# Patient Record
Sex: Female | Born: 1940 | Race: White | Hispanic: No | Smoking: Former smoker
Health system: Southern US, Community
[De-identification: ages and names within clinical notes are randomized; demographics above are authoritative.]

## PROBLEM LIST (undated history)

## (undated) DIAGNOSIS — E785 Hyperlipidemia, unspecified: Secondary | ICD-10-CM

## (undated) DIAGNOSIS — F419 Anxiety disorder, unspecified: Secondary | ICD-10-CM

## (undated) DIAGNOSIS — F329 Major depressive disorder, single episode, unspecified: Secondary | ICD-10-CM

## (undated) DIAGNOSIS — F32A Depression, unspecified: Secondary | ICD-10-CM

## (undated) DIAGNOSIS — I7300 Raynaud's syndrome without gangrene: Secondary | ICD-10-CM

## (undated) DIAGNOSIS — I498 Other specified cardiac arrhythmias: Secondary | ICD-10-CM

## (undated) DIAGNOSIS — D649 Anemia, unspecified: Secondary | ICD-10-CM

## (undated) DIAGNOSIS — M349 Systemic sclerosis, unspecified: Secondary | ICD-10-CM

## (undated) DIAGNOSIS — I10 Essential (primary) hypertension: Secondary | ICD-10-CM

## (undated) DIAGNOSIS — Z90710 Acquired absence of both cervix and uterus: Secondary | ICD-10-CM

## (undated) DIAGNOSIS — I2510 Atherosclerotic heart disease of native coronary artery without angina pectoris: Secondary | ICD-10-CM

## (undated) DIAGNOSIS — K449 Diaphragmatic hernia without obstruction or gangrene: Secondary | ICD-10-CM

## (undated) DIAGNOSIS — I480 Paroxysmal atrial fibrillation: Secondary | ICD-10-CM

## (undated) DIAGNOSIS — I471 Supraventricular tachycardia, unspecified: Secondary | ICD-10-CM

## (undated) DIAGNOSIS — K219 Gastro-esophageal reflux disease without esophagitis: Secondary | ICD-10-CM

## (undated) HISTORY — PX: TOTAL ABDOMINAL HYSTERECTOMY: SHX209

## (undated) HISTORY — DX: Hyperlipidemia, unspecified: E78.5

## (undated) HISTORY — DX: Supraventricular tachycardia, unspecified: I47.10

## (undated) HISTORY — DX: Other specified cardiac arrhythmias: I49.8

## (undated) HISTORY — DX: Diaphragmatic hernia without obstruction or gangrene: K44.9

## (undated) HISTORY — DX: Supraventricular tachycardia: I47.1

## (undated) HISTORY — DX: Atherosclerotic heart disease of native coronary artery without angina pectoris: I25.10

## (undated) HISTORY — DX: Acquired absence of both cervix and uterus: Z90.710

## (undated) HISTORY — DX: Raynaud's syndrome without gangrene: I73.00

## (undated) HISTORY — DX: Paroxysmal atrial fibrillation: I48.0

## (undated) HISTORY — DX: Essential (primary) hypertension: I10

## (undated) HISTORY — DX: Depression, unspecified: F32.A

## (undated) HISTORY — DX: Anemia, unspecified: D64.9

## (undated) HISTORY — PX: OTHER SURGICAL HISTORY: SHX169

## (undated) HISTORY — PX: BREAST ENHANCEMENT SURGERY: SHX7

## (undated) HISTORY — DX: Anxiety disorder, unspecified: F41.9

## (undated) HISTORY — DX: Systemic sclerosis, unspecified: M34.9

## (undated) HISTORY — DX: Major depressive disorder, single episode, unspecified: F32.9

## (undated) HISTORY — DX: Gastro-esophageal reflux disease without esophagitis: K21.9

---

## 1998-06-19 ENCOUNTER — Emergency Department (HOSPITAL_COMMUNITY): Admission: EM | Admit: 1998-06-19 | Discharge: 1998-06-19 | Payer: Self-pay | Admitting: *Deleted

## 1998-11-17 ENCOUNTER — Other Ambulatory Visit: Admission: RE | Admit: 1998-11-17 | Discharge: 1998-11-17 | Payer: Self-pay | Admitting: Family Medicine

## 2000-09-18 ENCOUNTER — Encounter: Admission: RE | Admit: 2000-09-18 | Discharge: 2000-09-18 | Payer: Self-pay | Admitting: Family Medicine

## 2000-09-18 ENCOUNTER — Encounter: Payer: Self-pay | Admitting: Family Medicine

## 2000-11-28 ENCOUNTER — Encounter: Payer: Self-pay | Admitting: Family Medicine

## 2000-11-28 ENCOUNTER — Encounter: Admission: RE | Admit: 2000-11-28 | Discharge: 2000-11-28 | Payer: Self-pay | Admitting: Family Medicine

## 2000-12-03 ENCOUNTER — Encounter: Admission: RE | Admit: 2000-12-03 | Discharge: 2000-12-03 | Payer: Self-pay | Admitting: Family Medicine

## 2000-12-03 ENCOUNTER — Encounter: Payer: Self-pay | Admitting: Family Medicine

## 2002-02-25 ENCOUNTER — Other Ambulatory Visit: Admission: RE | Admit: 2002-02-25 | Discharge: 2002-02-25 | Payer: Self-pay | Admitting: Family Medicine

## 2002-09-08 ENCOUNTER — Encounter: Admission: RE | Admit: 2002-09-08 | Discharge: 2002-09-08 | Payer: Self-pay | Admitting: Family Medicine

## 2002-09-08 ENCOUNTER — Encounter: Payer: Self-pay | Admitting: Family Medicine

## 2006-03-24 ENCOUNTER — Encounter: Admission: RE | Admit: 2006-03-24 | Discharge: 2006-03-24 | Payer: Self-pay | Admitting: Family Medicine

## 2007-07-29 ENCOUNTER — Ambulatory Visit: Payer: Self-pay | Admitting: Cardiovascular Disease

## 2007-07-29 ENCOUNTER — Encounter (INDEPENDENT_AMBULATORY_CARE_PROVIDER_SITE_OTHER): Payer: Self-pay | Admitting: Internal Medicine

## 2007-07-29 ENCOUNTER — Observation Stay (HOSPITAL_COMMUNITY): Admission: EM | Admit: 2007-07-29 | Discharge: 2007-08-01 | Payer: Self-pay | Admitting: Internal Medicine

## 2007-08-17 ENCOUNTER — Encounter: Admission: RE | Admit: 2007-08-17 | Discharge: 2007-08-17 | Payer: Self-pay | Admitting: Family Medicine

## 2007-08-28 ENCOUNTER — Ambulatory Visit: Payer: Self-pay | Admitting: Gastroenterology

## 2007-09-25 ENCOUNTER — Encounter: Payer: Self-pay | Admitting: Gastroenterology

## 2007-09-25 ENCOUNTER — Ambulatory Visit: Payer: Self-pay | Admitting: Gastroenterology

## 2007-10-23 ENCOUNTER — Ambulatory Visit: Payer: Self-pay | Admitting: Gastroenterology

## 2007-10-23 LAB — CONVERTED CEMR LAB
ALT: 18 units/L (ref 0–35)
Albumin: 3.5 g/dL (ref 3.5–5.2)
Alkaline Phosphatase: 48 units/L (ref 39–117)
BUN: 16 mg/dL (ref 6–23)
Calcium: 9.4 mg/dL (ref 8.4–10.5)
Creatinine, Ser: 0.8 mg/dL (ref 0.4–1.2)
Eosinophils Absolute: 0.1 10*3/uL (ref 0.0–0.6)
Eosinophils Relative: 2 % (ref 0.0–5.0)
Folate: 19.7 ng/mL
GFR calc Af Amer: 92 mL/min
HCT: 37.1 % (ref 36.0–46.0)
MCV: 85.5 fL (ref 78.0–100.0)
Platelets: 264 10*3/uL (ref 150–400)
Potassium: 4.2 meq/L (ref 3.5–5.1)
RBC: 4.34 M/uL (ref 3.87–5.11)
RDW: 13.5 % (ref 11.5–14.6)
Rhuematoid fact SerPl-aCnc: <20 intl units/mL — ABNORMAL LOW (ref 0.0–20.0)
Saturation Ratios: 15.5 % — ABNORMAL LOW (ref 20.0–50.0)
TSH: 2.8 microintl units/mL (ref 0.35–5.50)
Total Bilirubin: 0.6 mg/dL (ref 0.3–1.2)
Transferrin: 226.5 mg/dL (ref >=212.0)
WBC: 5.6 10*3/uL (ref 4.5–10.5)

## 2007-11-25 ENCOUNTER — Ambulatory Visit: Payer: Self-pay | Admitting: Cardiovascular Disease

## 2007-11-25 ENCOUNTER — Inpatient Hospital Stay (HOSPITAL_BASED_OUTPATIENT_CLINIC_OR_DEPARTMENT_OTHER): Admission: RE | Admit: 2007-11-25 | Discharge: 2007-11-26 | Payer: Self-pay | Admitting: Cardiovascular Disease

## 2007-11-25 LAB — CONVERTED CEMR LAB
Basophils Absolute: 0 10*3/uL (ref 0.0–0.1)
Eosinophils Absolute: 0.2 10*3/uL (ref 0.0–0.6)
GFR calc non Af Amer: 52 mL/min
HCT: 34.2 % — ABNORMAL LOW (ref 36.0–46.0)
Hemoglobin: 12 g/dL (ref 12.0–15.0)
MCHC: 35.2 g/dL (ref 30.0–36.0)
MCV: 84 fL (ref 78.0–100.0)
Monocytes Absolute: 0.8 10*3/uL — ABNORMAL HIGH (ref 0.2–0.7)
Neutro Abs: 7 10*3/uL (ref 1.4–7.7)
Neutrophils Relative %: 68.3 % (ref 43.0–77.0)
Potassium: 3.2 meq/L — ABNORMAL LOW (ref 3.5–5.1)
Sodium: 133 meq/L — ABNORMAL LOW (ref 135–145)
aPTT: 36.8 s — ABNORMAL HIGH (ref 21.7–29.8)

## 2007-11-26 ENCOUNTER — Ambulatory Visit: Payer: Self-pay | Admitting: Cardiovascular Disease

## 2007-11-26 ENCOUNTER — Inpatient Hospital Stay (HOSPITAL_COMMUNITY): Admission: AD | Admit: 2007-11-26 | Discharge: 2007-11-30 | Payer: Self-pay | Admitting: Cardiovascular Disease

## 2007-12-15 ENCOUNTER — Ambulatory Visit: Payer: Self-pay

## 2007-12-15 ENCOUNTER — Encounter: Payer: Self-pay | Admitting: Cardiovascular Disease

## 2007-12-15 ENCOUNTER — Ambulatory Visit: Payer: Self-pay | Admitting: Cardiovascular Disease

## 2007-12-18 ENCOUNTER — Inpatient Hospital Stay (HOSPITAL_COMMUNITY): Admission: EM | Admit: 2007-12-18 | Discharge: 2007-12-23 | Payer: Self-pay | Admitting: Emergency Medicine

## 2007-12-18 ENCOUNTER — Ambulatory Visit: Payer: Self-pay | Admitting: Internal Medicine

## 2007-12-31 DIAGNOSIS — F329 Major depressive disorder, single episode, unspecified: Secondary | ICD-10-CM

## 2007-12-31 DIAGNOSIS — I2510 Atherosclerotic heart disease of native coronary artery without angina pectoris: Secondary | ICD-10-CM

## 2007-12-31 DIAGNOSIS — I7300 Raynaud's syndrome without gangrene: Secondary | ICD-10-CM | POA: Insufficient documentation

## 2007-12-31 DIAGNOSIS — D649 Anemia, unspecified: Secondary | ICD-10-CM | POA: Insufficient documentation

## 2007-12-31 DIAGNOSIS — F411 Generalized anxiety disorder: Secondary | ICD-10-CM | POA: Insufficient documentation

## 2007-12-31 DIAGNOSIS — I498 Other specified cardiac arrhythmias: Secondary | ICD-10-CM | POA: Insufficient documentation

## 2007-12-31 DIAGNOSIS — K449 Diaphragmatic hernia without obstruction or gangrene: Secondary | ICD-10-CM | POA: Insufficient documentation

## 2007-12-31 DIAGNOSIS — E785 Hyperlipidemia, unspecified: Secondary | ICD-10-CM | POA: Insufficient documentation

## 2007-12-31 DIAGNOSIS — I4891 Unspecified atrial fibrillation: Secondary | ICD-10-CM | POA: Insufficient documentation

## 2007-12-31 DIAGNOSIS — K219 Gastro-esophageal reflux disease without esophagitis: Secondary | ICD-10-CM

## 2007-12-31 DIAGNOSIS — I10 Essential (primary) hypertension: Secondary | ICD-10-CM

## 2008-01-08 ENCOUNTER — Ambulatory Visit: Payer: Self-pay | Admitting: Cardiovascular Disease

## 2008-07-12 ENCOUNTER — Ambulatory Visit: Payer: Self-pay | Admitting: Cardiovascular Disease

## 2008-12-16 ENCOUNTER — Encounter: Payer: Self-pay | Admitting: Cardiovascular Disease

## 2008-12-21 ENCOUNTER — Ambulatory Visit: Payer: Self-pay | Admitting: Cardiovascular Disease

## 2008-12-21 ENCOUNTER — Encounter: Payer: Self-pay | Admitting: Cardiovascular Disease

## 2009-05-26 ENCOUNTER — Ambulatory Visit: Payer: Self-pay | Admitting: Cardiology

## 2009-05-26 ENCOUNTER — Telehealth: Payer: Self-pay | Admitting: Cardiovascular Disease

## 2009-05-27 ENCOUNTER — Inpatient Hospital Stay (HOSPITAL_COMMUNITY): Admission: EM | Admit: 2009-05-27 | Discharge: 2009-05-29 | Payer: Self-pay | Admitting: Emergency Medicine

## 2009-05-28 ENCOUNTER — Encounter: Payer: Self-pay | Admitting: Cardiology

## 2009-06-21 ENCOUNTER — Encounter: Admission: RE | Admit: 2009-06-21 | Discharge: 2009-06-21 | Payer: Self-pay | Admitting: Family Medicine

## 2009-06-21 ENCOUNTER — Encounter (INDEPENDENT_AMBULATORY_CARE_PROVIDER_SITE_OTHER): Payer: Self-pay | Admitting: Interventional Radiology

## 2009-06-21 ENCOUNTER — Other Ambulatory Visit: Admission: RE | Admit: 2009-06-21 | Discharge: 2009-06-21 | Payer: Self-pay | Admitting: Interventional Radiology

## 2009-07-03 ENCOUNTER — Ambulatory Visit: Payer: Self-pay | Admitting: Cardiovascular Disease

## 2009-07-03 DIAGNOSIS — E041 Nontoxic single thyroid nodule: Secondary | ICD-10-CM | POA: Insufficient documentation

## 2009-11-09 ENCOUNTER — Encounter (INDEPENDENT_AMBULATORY_CARE_PROVIDER_SITE_OTHER): Payer: Self-pay | Admitting: *Deleted

## 2010-01-12 ENCOUNTER — Encounter: Payer: Self-pay | Admitting: Cardiovascular Disease

## 2010-01-25 ENCOUNTER — Ambulatory Visit: Payer: Self-pay | Admitting: Cardiovascular Disease

## 2010-08-16 ENCOUNTER — Ambulatory Visit: Payer: Self-pay | Admitting: Cardiovascular Disease

## 2010-08-16 ENCOUNTER — Encounter: Payer: Self-pay | Admitting: Cardiovascular Disease

## 2010-11-24 ENCOUNTER — Encounter: Payer: Self-pay | Admitting: Family Medicine

## 2010-11-26 ENCOUNTER — Encounter: Payer: Self-pay | Admitting: Family Medicine

## 2010-12-04 NOTE — Assessment & Plan Note (Signed)
Summary: 6 month return.amber   History of Present Illness: Kathy George returns in followup.  She had previous stent to the LAD.  She hada re-look and the stent was widely patent despite atypical chest pain.The patient continues to feel fatigued.  I explained to her that herheart function was normal at 55%.  She should not be having any fatigueor shortness of breath from her heart.  She is not having any more chestpain. She needs to start exercising again.  She is on a lower dose of HCTZ and crestor now.  She has some plantar fascitis that is bothering her but otherwise nothing new  Current Problems (verified): 1)  Thyroid Nodule, Right  (ICD-241.0) 2)  Atrial Fibrillation, Paroxysmal  (ICD-427.31) 3)  Anemia  (ICD-285.9) 4)  Raynaud's Disease  (ICD-443.0) 5)  Depression  (ICD-311) 6)  Anxiety  (ICD-300.00) 7)  Dyslipidemia  (ICD-272.4) 8)  Supraventricular Tachycardia  (ICD-427.89) 9)  Cad  (ICD-414.00) 10)  Hypertension  (ICD-401.9) 11)  Hiatal Hernia  (ICD-553.3) 12)  Gerd  (ICD-530.81)  Current Medications (verified): 1)  Hydrochlorothiazide 25 Mg Tabs (Hydrochlorothiazide) .... 1/2 Tab Every Other Day 2)  Vivelle-Dot 0.0375 Mg/24hr Pttw (Estradiol) .... Twice Weekly 3)  Carvedilol 6.25 Mg Tabs (Carvedilol) .Marland Kitchen.. 1 Tab Two Times A Day 4)  Systane 0.4-0.3 % Soln (Polyethyl Glycol-Propyl Glycol) .... 2 Drops Two Times A Day As Needed 5)  Omeprazole 20 Mg Cpdr (Omeprazole) .Marland Kitchen.. 1 Tab By Mouth Once Daily 6)  Aspirin Ec 325 Mg Tbec (Aspirin) .... Take One Tablet By Mouth Daily 7)  Crestor 5 Mg Tabs (Rosuvastatin Calcium) .Marland Kitchen.. 1 Tab By Mouth Every Other Day 8)  One-A-Day Extras Antioxidant  Caps (Multiple Vitamins-Minerals) .Marland Kitchen.. 1 Tab By Mouth Once Daily 9)  Coq10 200 Mg Caps (Coenzyme Q10) .Marland Kitchen.. 1 Tab By Mouth Once Daily 10)  Vitamin B-12 .Marland Kitchen.. 1 Tab By Mouth Once Daily 11)  Vitamin D3 1000 Unit Caps (Cholecalciferol) .Marland Kitchen.. 1  Tab By Mouth Once Daily 12)  Nitrostat 0.4 Mg Subl (Nitroglycerin)  .Marland Kitchen.. 1 Tablet Under Tongue At Onset of Chest Pain; You May Repeat Every 5 Minutes For Up To 3 Doses. 13)  Allergy Medication .Marland Kitchen.. 1 Tab By Mouth Once Daily 14)  Ibuprofen 200 Mg Tabs (Ibuprofen) .... Tab By Mouth At Bedtime  Allergies (verified): No Known Drug Allergies  Past History:  Past Medical History: Last updated: 12/20/2008 Current Problems:  ATRIAL FIBRILLATION, PAROXYSMAL (ICD-427.31) ANEMIA (ICD-285.9) RAYNAUD'S DISEASE (ICD-443.0) DEPRESSION (ICD-311) ANXIETY (ICD-300.00) DYSLIPIDEMIA (ICD-272.4) SUPRAVENTRICULAR TACHYCARDIA (ICD-427.89) CAD (ICD-414.00): DES LAD 11/2006 patent on cath 2/09 HYPERTENSION (ICD-401.9) HIATAL HERNIA (ICD-553.3) GERD (ICD-530.81)  Past Surgical History: Last updated: 12/20/2008 hysterectomy age 66 breast implants CAD: Successful IVUS guided PCI of the lesion in the proximal   LAD using a Promus drug-eluting stent with improvement in Fentanyl   narrowing from 80% to 0% right rotator cuff repair  Family History: Last updated: 12/20/2008  Mother intracranial hemorrhage, chronic kidney disease   . Grandfather coronary artery disease, intracranial hemorrhage and   hypercholesterolemia.  Daughter also had hypercholesterolemia.   Social History: Last updated: 12/20/2008 She is single and lives with a boyfriend.  She has some college education. 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  Uses ethanol socially.Alcohol Use - yes  Review of Systems       Denies fever, malais, weight loss, blurry vision, decreased visual acuity, cough, sputum, SOB, hemoptysis, pleuritic pain, palpitaitons, heartburn, abdominal pain, melena, lower extremity edema, claudication, or  rash.   Vital Signs:  Patient profile:   70 year old female Height:      64 inches Weight:      166 pounds BMI:     28.60 Pulse rate:   57 / minute Resp:     12 per minute BP sitting:   130 / 80  (left arm)  Vitals Entered By: Kem Parkinson (August 16, 2010 11:15 AM)   Impression & Recommendations:  Problem # 1:  DYSLIPIDEMIA (ICD-272.4) Labs per [employer].  Cotnineu statin Her updated medication list for this problem includes:    Crestor 5 Mg Tabs (Rosuvastatin calcium) .Marland Kitchen... 1 tab by mouth every other day  Problem # 2:  CAD (ICD-414.00) Stable no angina and normla ECG Her updated medication list for this problem includes:    Carvedilol 6.25 Mg Tabs (Carvedilol) .Marland Kitchen... 1 tab two times a day    Aspirin Ec 325 Mg Tbec (Aspirin) .Marland Kitchen... Take one tablet by mouth daily    Nitrostat 0.4 Mg Subl (Nitroglycerin) .Marland Kitchen... 1 tablet under tongue at onset of chest pain; you may repeat every 5 minutes for up to 3 doses.  Problem # 3:  HYPERTENSION (ICD-401.9) WEll conrolled continue low sodium diet Her updated medication list for this problem includes:    Hydrochlorothiazide 25 Mg Tabs (Hydrochlorothiazide) .Marland Kitchen... 1/2 tab every other day    Carvedilol 6.25 Mg Tabs (Carvedilol) .Marland Kitchen... 1 tab two times a day    Aspirin Ec 325 Mg Tbec (Aspirin) .Marland Kitchen... Take one tablet by mouth daily  Other Orders: EKG w/ Interpretation (93000)  Patient Instructions: 1)  Your physician recommends that you schedule a follow-up appointment in: YEAR WITH DR Eden Emms 2)  Your physician recommends that you continue on your current medications as directed. Please refer to the Current Medication list given to you today.   EKG Report  Procedure date:  08/16/2010  Findings:      NSR 57 Normal ECG

## 2010-12-04 NOTE — Assessment & Plan Note (Signed)
Summary: F6M/DM   CC:  no compliants.  History of Present Illness: Kathy George returns in followup.  She had previous stent to the LAD.  She hada re-look and the stent was widely patent despite atypical chest pain.The patient continues to feel fatigued.  I explained to her that herheart function was normal at 55%.  She should not be having any fatigueor shortness of breath from her heart.  She is not having any more chestpain. She needs to start exercising again.  She has cut back on her Crestor and HCTZ due to lab abn. and myalgias.  We will review these from [facility] office when we get them.  Current Problems (verified): 1)  Thyroid Nodule, Right  (ICD-241.0) 2)  Atrial Fibrillation, Paroxysmal  (ICD-427.31) 3)  Anemia  (ICD-285.9) 4)  Raynaud's Disease  (ICD-443.0) 5)  Depression  (ICD-311) 6)  Anxiety  (ICD-300.00) 7)  Dyslipidemia  (ICD-272.4) 8)  Supraventricular Tachycardia  (ICD-427.89) 9)  Cad  (ICD-414.00) 10)  Hypertension  (ICD-401.9) 11)  Hiatal Hernia  (ICD-553.3) 12)  Gerd  (ICD-530.81)  Current Medications (verified): 1)  Hydrochlorothiazide 25 Mg Tabs (Hydrochlorothiazide) .... 1/2 Tab Every Other Day 2)  Vivelle-Dot 0.0375 Mg/24hr Pttw (Estradiol) .... Twice Weekly 3)  Carvedilol 6.25 Mg Tabs (Carvedilol) .Marland Kitchen.. 1 Tab Two Times A Day 4)  Systane 0.4-0.3 % Soln (Polyethyl Glycol-Propyl Glycol) .... 2 Drops Two Times A Day 5)  Nexium 40 Mg Cpdr (Esomeprazole Magnesium) .Marland Kitchen.. 1 Tab By Mouth Once Daily 6)  Aspirin Ec 325 Mg Tbec (Aspirin) .... Take One Tablet By Mouth Daily 7)  Crestor 5 Mg Tabs (Rosuvastatin Calcium) .Marland Kitchen.. 1 Tab By Mouth Every Toher Day 8)  One-A-Day Extras Antioxidant  Caps (Multiple Vitamins-Minerals) .Marland Kitchen.. 1 Tab By Mouth Once Daily 9)  Coq10 200 Mg Caps (Coenzyme Q10) .Marland Kitchen.. 1 Tab By Mouth Once Daily 10)  Vitamin B-12 .Marland Kitchen.. 1 Tab By Mouth Once Daily 11)  Vitamin D3 1000 Unit Caps (Cholecalciferol) .Marland Kitchen.. 1  Tab By Mouth Once Daily 12)  Vitamin C 500 Mg .Marland Kitchen.. 1  Tab By Mouth Once Daily 13)  Nitrostat 0.4 Mg Subl (Nitroglycerin) .Marland Kitchen.. 1 Tablet Under Tongue At Onset of Chest Pain; You May Repeat Every 5 Minutes For Up To 3 Doses.  Allergies (verified): No Known Drug Allergies  Past History:  Past Medical History: Last updated: 12/20/2008 Current Problems:  ATRIAL FIBRILLATION, PAROXYSMAL (ICD-427.31) ANEMIA (ICD-285.9) RAYNAUD'S DISEASE (ICD-443.0) DEPRESSION (ICD-311) ANXIETY (ICD-300.00) DYSLIPIDEMIA (ICD-272.4) SUPRAVENTRICULAR TACHYCARDIA (ICD-427.89) CAD (ICD-414.00): DES LAD 11/2006 patent on cath 2/09 HYPERTENSION (ICD-401.9) HIATAL HERNIA (ICD-553.3) GERD (ICD-530.81)  Past Surgical History: Last updated: 12/20/2008 hysterectomy age 79 breast implants CAD: Successful IVUS guided PCI of the lesion in the proximal   LAD using a Promus drug-eluting stent with improvement in Fentanyl   narrowing from 80% to 0% right rotator cuff repair  Family History: Last updated: 12/20/2008  Mother intracranial hemorrhage, chronic kidney disease   . Grandfather coronary artery disease, intracranial hemorrhage and   hypercholesterolemia.  Daughter also had hypercholesterolemia.   Social History: Last updated: 12/20/2008 She is single and lives with a boyfriend.  She has some college education. 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  Uses ethanol socially.Alcohol Use - yes  Review of Systems       Denies fever, malais, weight loss, blurry vision, decreased visual acuity, cough, sputum, SOB, hemoptysis, pleuritic pain, palpitaitons, heartburn, abdominal pain, melena, lower extremity edema, claudication, or rash.   Vital Signs:  Patient profile:   70 year old female Height:      64 inches Weight:      163 pounds BMI:     28.08 Pulse rate:   54 / minute Resp:     12 per minute BP sitting:   146 / 76  (left arm)  Vitals Entered By: Kem Parkinson (January 25, 2010 3:50 PM)  Physical Exam  General:  Affect  appropriate Healthy:  appears stated age HEENT: normal Neck supple with no adenopathy JVP normal no bruits no thyromegaly Lungs clear with no wheezing and good diaphragmatic motion Heart:  S1/S2 no murmur,rub, gallop or click PMI normal Abdomen: benighn, BS positve, no tenderness, no AAA no bruit.  No HSM or HJR Distal pulses intact with no bruits Plus one bilateral edema Neuro non-focal Skin warm and dry    Impression & Recommendations:  Problem # 1:  ATRIAL FIBRILLATION, PAROXYSMAL (ICD-427.31) Maint NSR with no palpitations Her updated medication list for this problem includes:    Carvedilol 6.25 Mg Tabs (Carvedilol) .Marland Kitchen... 1 tab two times a day    Aspirin Ec 325 Mg Tbec (Aspirin) .Marland Kitchen... Take one tablet by mouth daily  Problem # 2:  DYSLIPIDEMIA (ICD-272.4) Continue crestor labs per primary Her updated medication list for this problem includes:    Crestor 5 Mg Tabs (Rosuvastatin calcium) .Marland Kitchen... 1 tab by mouth every toher day  Problem # 3:  SUPRAVENTRICULAR TACHYCARDIA (ICD-427.89) Stabel no palpitaiotns Her updated medication list for this problem includes:    Carvedilol 6.25 Mg Tabs (Carvedilol) .Marland Kitchen... 1 tab two times a day    Aspirin Ec 325 Mg Tbec (Aspirin) .Marland Kitchen... Take one tablet by mouth daily    Nitrostat 0.4 Mg Subl (Nitroglycerin) .Marland Kitchen... 1 tablet under tongue at onset of chest pain; you may repeat every 5 minutes for up to 3 doses.  Problem # 4:  CAD (ICD-414.00) Stable no angina Her updated medication list for this problem includes:    Carvedilol 6.25 Mg Tabs (Carvedilol) .Marland Kitchen... 1 tab two times a day    Aspirin Ec 325 Mg Tbec (Aspirin) .Marland Kitchen... Take one tablet by mouth daily    Nitrostat 0.4 Mg Subl (Nitroglycerin) .Marland Kitchen... 1 tablet under tongue at onset of chest pain; you may repeat every 5 minutes for up to 3 doses.  Patient Instructions: 1)  Your physician wants you to follow-up in: 6 months with Dr Haywood Filler will receive a reminder letter in the mail two months in  advance. If you don't receive a letter, please call our office to schedule the follow-up appointment. 2)  Your physician recommends that you continue on your current medications as directed. Please refer to the Current Medication list given to you today. Prescriptions: NITROSTAT 0.4 MG SUBL (NITROGLYCERIN) 1 tablet under tongue at onset of chest pain; you may repeat every 5 minutes for up to 3 doses.  #25 x 12   Entered by:   Kem Parkinson   Authorized by:   Colon Branch, MD, [facility]   Signed by:   Kem Parkinson on 01/25/2010   Method used:   Electronically to        [facility] [phone]* (retail)       [address]       [place], [place]  [address]       Ph: [phone] or [phone]       Fax: [phone]   RxID:   [id number]

## 2010-12-04 NOTE — Letter (Signed)
Summary: Appointment - Reminder 2  [facility], Main Office  [address]   [place], [place] [address]   Phone: [phone]  Fax: [phone]     November 09, 2009 MRN: [id number]   [facility] [address] [place], [place]  [address]   Dear Ms. Aragones,  Our records indicate that it is time to schedule a follow-up appointment with Dr. Eden Emms. It is very important that we reach you to schedule this appointment. We look forward to participating in your health care needs. Please contact us at the number listed above at your earliest convenience to schedule your appointment.  If you are unable to make an appointment at this time, give [place] a call so we can update our records.  Sincerely,   Migdalia Dk [facility] Scheduling Team

## 2011-02-10 LAB — CK TOTAL AND CKMB (NOT AT ARMC)
CK, MB: 0.9 ng/mL (ref 0.3–4.0)
Relative Index: INVALID (ref 0.0–2.5)
Total CK: 60 U/L (ref 7–177)

## 2011-02-10 LAB — COMPREHENSIVE METABOLIC PANEL
ALT: 13 U/L (ref 0–35)
ALT: 19 U/L (ref 0–35)
AST: 17 U/L (ref 0–37)
Albumin: 3.5 g/dL (ref 3.5–5.2)
Albumin: 3.6 g/dL (ref 3.5–5.2)
Alkaline Phosphatase: 44 U/L (ref 39–117)
Alkaline Phosphatase: 45 U/L (ref 39–117)
BUN: 18 mg/dL (ref 6–23)
Chloride: 98 mEq/L (ref 96–112)
Glucose, Bld: 99 mg/dL (ref 70–99)
Potassium: 3.5 mEq/L (ref 3.5–5.1)
Potassium: 4 mEq/L (ref 3.5–5.1)
Sodium: 135 mEq/L (ref 135–145)
Total Bilirubin: 0.6 mg/dL (ref 0.3–1.2)
Total Protein: 6.8 g/dL (ref 6.0–8.3)

## 2011-02-10 LAB — HEPARIN LEVEL (UNFRACTIONATED)
Heparin Unfractionated: 0.22 IU/mL — ABNORMAL LOW (ref 0.30–0.70)
Heparin Unfractionated: 0.58 IU/mL (ref 0.30–0.70)
Heparin Unfractionated: 0.84 IU/mL — ABNORMAL HIGH (ref 0.30–0.70)

## 2011-02-10 LAB — LIPID PANEL
Total CHOL/HDL Ratio: 4 RATIO
VLDL: 19 mg/dL (ref 0–40)

## 2011-02-10 LAB — DIFFERENTIAL
Basophils Absolute: 0 10*3/uL (ref 0.0–0.1)
Basophils Relative: 0 % (ref 0–1)
Eosinophils Absolute: 0.1 10*3/uL (ref 0.0–0.7)
Eosinophils Relative: 3 % (ref 0–5)
Lymphocytes Relative: 31 % (ref 12–46)
Monocytes Absolute: 0.5 10*3/uL (ref 0.1–1.0)
Monocytes Relative: 8 % (ref 3–12)
Neutro Abs: 4 10*3/uL (ref 1.7–7.7)
Neutrophils Relative %: 58 % (ref 43–77)
Neutrophils Relative %: 59 % (ref 43–77)

## 2011-02-10 LAB — CARDIAC PANEL(CRET KIN+CKTOT+MB+TROPI)
CK, MB: 0.9 ng/mL (ref 0.3–4.0)
Total CK: 63 U/L (ref 7–177)
Troponin I: 0.02 ng/mL (ref 0.00–0.06)

## 2011-02-10 LAB — CBC
HCT: 33.2 % — ABNORMAL LOW (ref 36.0–46.0)
HCT: 33.5 % — ABNORMAL LOW (ref 36.0–46.0)
HCT: 35.1 % — ABNORMAL LOW (ref 36.0–46.0)
Hemoglobin: 11.4 g/dL — ABNORMAL LOW (ref 12.0–15.0)
Hemoglobin: 12.1 g/dL (ref 12.0–15.0)
Hemoglobin: 12.3 g/dL (ref 12.0–15.0)
MCHC: 33.4 g/dL (ref 30.0–36.0)
MCHC: 34.1 g/dL (ref 30.0–36.0)
MCV: 89.7 fL (ref 78.0–100.0)
Platelets: 154 10*3/uL (ref 150–400)
RBC: 3.7 MIL/uL — ABNORMAL LOW (ref 3.87–5.11)
RBC: 3.97 MIL/uL (ref 3.87–5.11)
RDW: 15 % (ref 11.5–15.5)
RDW: 15 % (ref 11.5–15.5)
RDW: 15 % (ref 11.5–15.5)

## 2011-02-10 LAB — URINALYSIS, ROUTINE W REFLEX MICROSCOPIC
Protein, ur: NEGATIVE mg/dL
Urobilinogen, UA: 0.2 mg/dL (ref 0.0–1.0)

## 2011-02-10 LAB — D-DIMER, QUANTITATIVE: D-Dimer, Quant: 0.42 ug/mL-FEU (ref 0.00–0.48)

## 2011-02-10 LAB — POCT CARDIAC MARKERS: Troponin i, poc: <0.05 ng/mL (ref 0.00–0.09)

## 2011-02-10 LAB — TROPONIN I: Troponin I: 0.01 ng/mL (ref 0.00–0.06)

## 2011-02-22 ENCOUNTER — Other Ambulatory Visit: Payer: Self-pay | Admitting: Cardiovascular Disease

## 2011-03-19 NOTE — Cardiovascular Report (Signed)
NAMEVICKTORIA, Kathy George                ACCOUNT NO.:  [id number]   MEDICAL RECORD NO.:  [id number]          PATIENT TYPE:  INP   LOCATION:  4714                         FACILITY:  [facility]   PHYSICIAN:  Bevelyn Buckles. Bensimhon, MDDATE OF BIRTH:  11-19-1940   DATE OF PROCEDURE:  DATE OF DISCHARGE:                            CARDIAC CATHETERIZATION   PRIMARY CARE PHYSICIAN:  Talmadge Coventry, MD.   CARDIOLOGIST:  [name]. Eden Emms, MD.   PATIENT IDENTIFICATION:  Kathy George is a 70 year old woman with a history  of hypertension, hyperlipidemia and Raynaud disease.  She had also a  history of coronary artery disease, status post drug-eluting stent to  her LAD.  On November 26, 2006, she was admitted with the chest pain.  D-  dimer was mildly elevated.  She had a negative CT scan of the chest.  She is now referred for diagnostic angiography to evaluate for patency  of her LAD stent.  Her cardiac markers have been negative.  She has been  compliant with her Plavix.   PROCEDURES PERFORMED:  1. Selective coronary angiography.  2. Left heart catheterization.  3. Left ventriculogram.  4. [place] femoral artery closure.   DESCRIPTION OF PROCEDURE:  The risks and indication of the procedure was  explained.  Consent was signed and placed in the chart.  A 5-French  arterial sheath was placed in the right femoral artery using a modified  Seldinger technique.  Standard catheters including JL-4, JR-4 and angled  pigtail were used for the procedure and all catheter exchanges made over  wire.  There were no apparent complications.   Central aortic pressure was 142/70 with a mean of 99.  LV pressure 147/0  with an LVEDP of 14.  There was no aortic stenosis on pullback.   Left main had distal 30% stenosis.   LAD was a long vessel wrapping the apex and had a stent placed in the  proximal portion.  There was a 30% ostial lesion contiguous with the  left main.  The stent was widely patent.  There was a  tubular 30% lesion  in the midsection after the stent.  There were two small diagonals.   Left circumflex gave off a tiny ramus, a large branching OM-1, a small  OM-2.  It was angiographically normal.   Right coronary artery is a moderate-sized, dominant vessel.  It gave off  an RV branch, an acute marginal that filled the PDA territory, and a  small posterolateral.  There is a 30% lesion in the midportion and a 30%  lesion more distally after the takeoff of the acute marginal.   Left left ventriculogram done in the RAO position showed an EF of 65%  with no wall motion abnormalities.   ASSESSMENT:  1. Patent left anterior descending artery stent, otherwise mild to      moderate nonobstructive coronary disease.  2. Normal left ventricular function.   PLAN/DISCUSSION:  I suspect her chest pain is likely noncardiac.  If  everything else is stable, she can go home in the morning.      Bevelyn Buckles. Bensimhon,  MD  Electronically Signed     DRB/MEDQ  D:  12/21/2007  T:  12/22/2007  Job:  [id number]   cc:   Talmadge Coventry, M.D.  [name]. Eden Emms, MD, [facility]

## 2011-03-19 NOTE — Assessment & Plan Note (Signed)
[facility]                            CARDIOLOGY OFFICE NOTE   Kathy, George                         MRN:          [id number]  DATE:11/25/2007                            DOB:          01/08/1941    [name] is a 70 year old patient we saw in the hospital in September  for chest pain.  The pain was atypical.  There is a question of a breast  implant that may have ruptured. She apparently had follow-up for this in  regards to an MRI and there was no problems.  She apparently had  significant anemia and she tells me this has been worked out without any  problems.  She continues to have chest pain.  She was quite emotional  today. She started to cry. Pain is atypical, she has sharp pain and  sometimes it is indigestion like. It can radiate to the arm. However, it  has become increasingly incapacitating. It sometimes radiates to the  back.  It is not necessarily exertionally related.  She had a 2-D  echocardiogram that was normal. As far as I know, she has not had any  stress testing since leaving the hospital despite our recommendations  for an outpatient Myoview.   The patient wants to know whether or not she has significant coronary  disease.  I told her the best way to do this would be to proceed with  heart cath. Risks including stroke, need for emergency surgery,  bleeding, dye reaction were discussed; she is willing to proceed.   Coronary risk factors include hyperlipidemia, hypertension.  She also  interestingly has Raynaud's disease and may be a risk for spasm.  She  apparently was tried on what sounds like a Procardia type of product in  the past but she was not tolerant of it.   Her review of systems otherwise negative.   HOME MEDS:  1. Hydrochlorothiazide 25 a day.  2. Prilosec 40 a day.  3. Estradiol 0.325 mg half a tablet a day.  4. Venal 0.0375 one a week.  5. Claritin over-the-counter.  6. Advil two to four day which she takes  for her chest pain.   PAST MEDICAL HISTORY:  1. Gastroesophageal reflux disease.  2. Raynaud's disease.  3. She is postmenopausal status post a direct hysterectomy.  4. Right rotator cuff surgery.  5. Status post breast implant surgery.  6. Chronic chest pain syndrome.  7. Hypertension.  8. Untreated hyperlipidemia.   Her she denies any allergies.   PHYSICAL EXAMINATION:  Is remarkable for an emotional white female in no  distress.  Pulse is 82, blood pressure is 130/80, respiratory rate 14,  afebrile.  HEENT:  Unremarkable.  Carotids normal without bruit, no  lymphadenopathy, thyromegaly, JVP elevation.  LUNGS:  Clear with good  diaphragmatic motion.  No wheezing.  S1, S2 with normal heart sounds.  PMI normal.  ABDOMEN:  Benign.  Bowel sounds positive.  No bruit, no  hepatosplenomegaly. No hepatojugular reflux.  No bruits.  Distal pulses are intact, no edema.  SKIN:  Warm and dry.  NEURO:  Nonfocal.  No muscular weakness.   EKG shows sinus rhythm, nonspecific ST-T wave changes, T-wave inversions  in III and F as well as lateral leads and poor R wave progression.   IMPRESSION:  1. Ongoing chest pain with an abnormal EKG. Despite her normal echo, I      think that we should proceed with heart cath.  We will try to do      this tomorrow at 11:30 in the [facility] lab.  Continue baby aspirin a day.  2. Raynaud's disease, possible connective tissue disease.  She needs      further follow-up with Dr. Smith Mince in regards to workup for this      including rheumatoid factor, ANA and sed rate. Apparently this has      been done before in the past at [facility], but she has not had recent      workup for this. At some point, it may be worthwhile to rechallenge      her on Sular or least get some records to see what she was      intolerant to. She does wear gloves in the cold weather.  3. Hyperlipidemia.  Lipid and liver profile to be followed, consider      addition of statin drug, pending results  of coronary arteriography  4. Hypertension, lower extremity edema. Currently on      hydrochlorothiazide 25 a day, stable. Low-salt diet.  5. Anxiety and depression related to pain syndrome. Consider SSRI.      Will leave this up to Dr. Smith Mince and her primary care docs.  6. Musculoskeletal type pain.  Consider switching to Celebrex 200      b.i.d. if she does not have coronary disease.   Further recommendations will be based on the results of her heart cath.     [name]. Eden Emms, MD, [facility]  Electronically Signed    PCN/MedQ  DD: 11/25/2007  DT: 11/25/2007  Job #: [id number]

## 2011-03-19 NOTE — Discharge Summary (Signed)
Kathy George, Kathy George                ACCOUNT NO.:  [id number]   MEDICAL RECORD NO.:  [id number]          PATIENT TYPE:  INP   LOCATION:  2039                         FACILITY:  [facility]   PHYSICIAN:  Kathy Pick. Eden Emms, MD, FACCDATE OF BIRTH:  02/15/1941   DATE OF ADMISSION:  11/26/2007  DATE OF DISCHARGE:  11/28/2007                               DISCHARGE SUMMARY   PRIMARY CARDIOLOGIST:  Theron Arista C. Eden Emms, MD, [facility]   PRIMARY CARE PHYSICIAN:  Talmadge Coventry, MD   PROCEDURES PERFORMED DURING HOSPITALIZATION:  1. Cardiac catheterization performed by Dr. Charlton Haws, revealing      proximal LAD, centric, 70% to 80% stenosis.  The lesion looked      moderate in all other views.  However, an LAO cranial was clearly      eccentric and greater than 70% stenosis.  The mid and distal LAD      were normal.  Circumflex coronary artery was nondominant and      normal.  There was a large obtuse marginal branch, which was      normal.  The right coronary artery as dominant and normal.  EF was      65%.  2. Status post intravascular ultrasound and stent to the LAD for Dr.      Charlies Constable, using a 3.0 mm x 18 mm Promise stent, reducing      stenosis from 80% to 0%.  Please see Dr. Regino Schultze thorough note for      more details.   FINAL DISCHARGE DIAGNOSES:  1. Coronary artery disease.      a.     Status post percutaneous coronary intervention with Promise       stent to the left anterior descending.  2. Supraventricular tachycardia.      a.     Status post amiodarone drip with p.o. amiodarone on       discharge.  3. Raynaud's disease.  4. Hyperlipidemia.  5. Hypertension.  6. Anxiety and depression.  7. Musculoskeletal pain.   HOSPITAL COURSE:  This 70 year old female patient with the above-  mentioned diagnoses, who was admitted secondary to recurrent chest  discomfort.  The patient had a normal echocardiogram, but secondary to  cardiovascular risk factors to include hyperlipidemia and  hypertension,  and the patient's shows discomfort.  The patient was admitted to rule  out cardiac etiology for chest discomfort.   The patient underwent coronary angiogram on November 26, 2007 by Dr.  Charlton Haws, which revealed a 70% mid LAD lesion, which subsequently  had a stent placed by Dr. Charlies Constable the following day using a Promise  stent.  The patient was placed on Plavix, aspirin, metoprolol.  The  patient also had an episode of SVT post procedure and was started on  amiodarone drip.  The patient's heart rate was well controlled and was  started on p.o. amiodarone on the day of discharge.  The patient was  seen and examined by Dr. Charlton Haws, with complaint of lower abdominal  pain at the site of the right groin insertion site for catheterization.  The  patient will undergo CT of the abdomen to rule out perineal bleed.  If negative, will be discharged home.   DISCHARGE LABS:  Hemoglobin 10.5,  hematocrit 30.4.  Platelets 266,  white blood cells 7.7.  Sodium 133, potassium 4.6, chloride 100, CO2 27,  glucose 117, BUN 11, creatinine 0.84.  PT 36.8.  PT 11.7.  INR 0.9.   Vital signs on discharge, blood pressure 95/34, pulse 72, respirations  20, temperature 98.3, O2 93% on room air.  EKG revealing normal sinus  rhythm with ventricular rate of 66 beats per minute with low voltage QRS  noted.   DISCHARGE MEDICATIONS:  1. Amiodarone 200 mg twice a day.  2. Protonix 40 mg daily.  3. Aspirin 325 mg daily.  4. Metoprolol 25 mg twice a day.  5. Plavix 75 mg daily.  6. Estradiol 0.325 mg, 1/2 tablet daily.  7. Hydrochlorothiazide 12.5 mg daily.   ALLERGIES:  NO KNOWN DRUG ALLERGIES.   FOLLOWUP PLAN AND APPOINTMENT:  The patient will be followed by Dr.  Charlton Haws.  Our office call if this is on a weekend.  The patient is  to follow up with her primary care physician for continued medical  management.  The patient is to be given post cardiac catheterization  instructions  with particular emphasis on the right groin site for  evidence of bleeding, hematoma, signs of infection or severe pain.   TIME SPENT WITH THE PATIENT:  To include physician time of 40 minutes.   Awaiting results of CT scan prior to discharge.      Kathy Mare. Lyman Bishop, NP      Kathy Pick. Eden Emms, MD, [facility]  Electronically Signed    KML/MEDQ  D:  11/28/2007  T:  11/28/2007  Job:  [id number]

## 2011-03-19 NOTE — Assessment & Plan Note (Signed)
[facility]                         GASTROENTEROLOGY OFFICE NOTE   NAVIE, LAMOREAUX                         MRN:          [id number]  DATE:10/23/2007                            DOB:          04/10/1941    Ms. Bold continues with chest pain which is finally abated after taking  Nexium 40 mg twice a day and Carafate suspension 1 g after meals and at  bedtime.  Her endoscopy on September 25, 2007 did show a 5-cm hiatal  hernia without erosive esophagitis.  Mucosal biopsies were unremarkable.  Colonoscopy was entirely normal.   I still think that Mrs. Riederer most likely has acid reflux, and we  finally got her chest pain under control.  She does have Raynaud's  phenomenon, but apparently has had a thorough workup at [facility] for  collagen vascular disease, and has not had any other signs of lupus or  scleroderma.  Apparently has had skin biopsies, although this workup was  several years ago.  She continues to complain of numbness and pain in  her hands, but no other systemic, rheumatologic, or generalized medical  complaints.  She is somewhat concerned that she may have a cerebral  aneurysm, since this apparently runs in her family.  I have asked her to  speak with Dr. Smith Mince about possible cerebral MRIs, a prophylactic  measure.   She is awake, alert, in no acute distress.  She weighs 153 pounds, and blood pressure is 126/72, and pulse is 68 and  regular.  She did have swollen MP joints in both hands bilaterally and very cold  digits.  I could not see any periungual telangiectasias suggestive of  scleroderma.  I did not repeat general physical exam.   ASSESSMENT:  1. Raynaud's phenomenon with probable esophageal motility disorder and      severe acid reflux.  2. Rule out underlying collage vascular disease, although I think this      is unlikely.  3. Postmenopausal, on Vivelle weekly patches.  4. Status post hysterectomy.   RECOMMENDATIONS:  1.  Continue vigorous reflux management and all medications listed      above.  2. She has been scheduled for esophageal monometry and 24-hour pH      probe testing in February, which is the shortest time we could      schedule her.  3. Continue primary care followup with Dr. Smith Mince and possible      further neuro workup.     Vania Rea. Jarold Motto, MD, [facility], FAGA  Electronically Signed    DRP/MedQ  DD: 10/23/2007  DT: 10/24/2007  Job #: [id number]   cc:   Talmadge Coventry, M.D.

## 2011-03-19 NOTE — Assessment & Plan Note (Signed)
[facility]                            CARDIOLOGY OFFICE NOTE   Kathy, George                         MRN:          [id number]  DATE:07/12/2008                            DOB:          07/19/1941    [name] returns today for followup.  She has had a previous stent to  the LAD in January 2009.  She had a repeat cath in February and the  stent was widely patent.  She has not had any recurrent chest pains.  She is anxious to get off her Plavix since it is very expensive.  I told  her I would probably keep her on it at least for year.  At that point,  It would be reasonable to stop her Plavix.   She has not been totally compliant with her simvastatin either.  I  explained to her that this is only a $4 a month prescription in that she  needs to take in regards to her risk factors.   She has had multiple normal LFTs in the past and tolerates the statin  drugs without problems.  Her last LDL cholesterol that I have in the  chart was 113 on December 19, 2007.  I believe this was on therapy.   Otherwise, she is doing well.  She is traveling a lot.  She is working  on a cruise.  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  She has a boyfriend that she has lived with for 7 years and  they are going to [place] soon.  She seems happy and is not having any PND  or orthopnea.  There has been no palpitations or syncope.   REVIEW OF SYSTEMS:  Otherwise negative.   She is on:  1. Hydrochlorothiazide 25 a day.  2. Vivelle 0.0375 once a week.  3. Aspirin a day.  4. Plavix 75 a day.  5. Carvedilol 6.25 b.i.d.  6. Zocor 40 at bedtime.  7. Omeprazole.  8. Potassium.   PHYSICAL EXAMINATION:  GENERAL:  Remarkable for an overweight white  female in no distress.  Affect is appropriate.  VITAL SIGNS:  Weight is 159, blood pressure 133/70, pulse 67 and  regular, respiratory rate is 14, and afebrile.  HEENT:  Unremarkable.  NECK:  Carotids  are normal without bruit.  No lymphadenopathy,  thyromegaly, or JVP elevation.  LUNGS:  Clear.  Good diaphragmatic motion.  No wheezing.  CARDIAC:  S1 and S2.  Normal heart sounds.  PMI normal.  ABDOMEN:  Benign.  Bowel sounds positive.  No AAA.  No tenderness.  No  bruit.  No hepatosplenomegaly.  No hepatojugular reflux.  No tenderness.  EXTREMITIES:  Distal pulses are intact.  No edema.  NEURO:  Nonfocal.  SKIN:  Warm and dry.  No muscular weakness.   IMPRESSION:  1. Coronary artery disease, previous stent to the left anterior      descending.  Continue aspirin, Plavix and beta-blocker.  Probably      stop Plavix in January 2010.  2. Hypercholesteremia in the setting of  coronary artery disease.      Resume Zocor.  Lipid and liver profile in 6 months.  3. History of Raynaud disease, currently stable.  I consider addition      of Procardia in the future, if needed.  She sees Dr. Phylliss Bob and may      have a former scleroderma, however, currently there is no signs of      significant esophageal disease.  She does have some chronic changes      in her fingertips, but does not seem to complain too much.  4. History of reflux.  Continue omeprazole and Nexium.  Overall, I      think her cardiac status is stable and I will see her back in 6      month.     [name]. Eden Emms, MD, [facility]  Electronically Signed    PCN/MedQ  DD: 07/12/2008  DT: 07/13/2008  Job #: [id number]

## 2011-03-19 NOTE — Discharge Summary (Signed)
NAMEERNEST, Kathy George                ACCOUNT NO.:  [id number]   MEDICAL RECORD NO.:  [id number]          PATIENT TYPE:  OBV   LOCATION:  2003                         FACILITY:  [facility]   PHYSICIAN:  Wilson Singer, M.D.DATE OF BIRTH:  [birth year]   DATE OF ADMISSION:  07/29/2007  DATE OF DISCHARGE:  08/01/2007                               DISCHARGE SUMMARY   FINAL DISCHARGE DIAGNOSIS:  Atypical chest pain, possible left  intracapsular breast implant rupture.   MEDICATIONS ON DISCHARGE:  1. Estradiol transdermal patch as at home.  2. Prilosec 20 mg daily.  3. Hydrochlorothiazide 12.5 mg daily.  4. Ibuprofen 800 mg 3 times a day p.r.n. for pain   CONDITION ON DISCHARGE:  Stable.   HISTORY:  This very pleasant 71 year old lady was admitted with chest  pain.  Please see initial History and Physical examination done by Dr.  Christella Noa.   HOSPITAL PROGRESS:  The patient was admitted to telemetry, and serial  cardiac enzymes were negative.  She was  also seen by cardiology.  D-  dimer was also negative, but in view of the type of chest pain which  seemed to be pleuritic in nature, she underwent a CT angio chest scan.  The scan was negative for pulmonary embolism but was suggesting the  possibility of a left intracapsular breast implant rupture.  Throughout  her hospital stay, the chest pain improved, especially with ibuprofen  that was instituted.  She was hypotensive after institution of beta  blockers and ACE inhibitors as well as Nitropaste, but this improved  once all these medication were discontinued.  She did require some fluid  boluses while in the hospital.   On physical examination today, she has without any symptoms.  She looks  well.  Temperature 98.7, blood pressure 110/65, pulse 73, saturation  100% on room air.  There is no orthostatic hypotension.  The most recent  hemoglobin done yesterday afternoon was 10.5. This is increased from  9.5.  All of this may be dilutional  in view of the intravenous fluids  that she has been receiving, but this clearly needs to be repeated.   DISPOSITION:  I have asked her to follow up as soon as possible with her  primary care physician where an MRI breast scan would be recommended  with particular view to rupture protocol of breast implant.  This has  been the  recommendation of the radiology department.  Also, I would suggest that  her hemoglobin be checked to make sure this is improving.  She has been  specifically asked to follow up with her primary care physician early  next week for followup of this.      Wilson Singer, M.D.  Electronically Signed     NCG/MEDQ  D:  08/01/2007  T:  08/01/2007  Job:  [id number]   cc:   Talmadge Coventry, M.D.

## 2011-03-19 NOTE — Cardiovascular Report (Signed)
Kathy George, Kathy George                ACCOUNT NO.:  [id number]   MEDICAL RECORD NO.:  [id number]          PATIENT TYPE:  INP   LOCATION:  6527                         FACILITY:  [facility]   PHYSICIAN:  Bruce R. Juanda Chance, MD, FACCDATE OF BIRTH:  [birth year]   DATE OF PROCEDURE:  11/27/2007  DATE OF DISCHARGE:                            CARDIAC CATHETERIZATION   CLINICAL HISTORY:  [name] is a 70 year old and has been evaluated for  chest pain.  She underwent catheterization by Dr. Eden Emms yesterday and  was found to have a lesion in the proximal LAD estimated at 80%.  She  was scheduled for evaluation for percutaneous coronary invention today.  She also has atrial arrhythmias which appeared to either be atrial  flutter or multi focal atrial tachycardia.  She is currently on IV  amiodarone for this.   PROCEDURE:  The procedure was followed by femoral artery using arterial  sheath and a 6-French Q-3.5 guiding catheter with side holes.  The  patient had been given chewable aspirin today, 324 mg and received 300  mg Plavix load yesterday.  She was given Bivalirudin bolus and infusion.  We passed a Prowater wire down the LAD across the lesion without  difficulty.  Using Atlantis IVUS catheter, we passed the catheter distal  lesion to automatic pullback.  This demonstrated a very soft plaque with  narrowing of less than 2 mm at its tightest point and with what appeared  to be a ruptured plaque.  Based on the measurements, we stented the  lesion with a 3.0 x 18 mm Promus stent deploying this with one inflation  of 12 atmospheres for 30 seconds.  We then postdilated with a 3.5 x 15  mm Quantum Maverick performing one inflation up to 15 atmospheres for 20  seconds.  The IVUS measurements had shown proximal reference diameter of  3.5 and distal reference diameter of 3.0.  We then performed an IVUS run  which showed good expansion and apposition of the stent.  Final  diagnostic studies were then  performed through guiding catheter.  The  patient tolerated lost laboratory in satisfactory condition.  The right  femoral was closed at the end of the procedure with Angio-Seal.  There  was some oozing at the end procedure which required pressure, but then  this appeared to resolve.   RESULTS:  Initially, the stenosis in the proximal LAD was estimated at  80%.  Following stenting this improved to 0%.   The IVUS measurements showed a very soft plaque with possible thrombus  and there appeared to be an area of ruptured plaque with moving blood  behind the small element of the plaque.  The distal reference was 3.0  and the proximal reference lumen was 3.5.  Following stenting, there was  good expansion of the stent throughout, good transitions both proximally  and distally and full apposition.   CONCLUSION:  Successful IVUS guided PCI of the lesion in the proximal  LAD using a Promus drug-eluting stent with improvement in Fentanyl  narrowing from 80% to 0%.   DISPOSITION:  The patient returned to  the post-anesthesia care unit for  further observation.      Bruce Elvera Lennox Juanda Chance, MD, [facility]  Electronically Signed     BRB/MEDQ  D:  11/27/2007  T:  11/28/2007  Job:  [id number]   cc:   [name]. Eden Emms, MD, [facility]  Talmadge Coventry, M.D.  Cardiopulmonary Laboratory

## 2011-03-19 NOTE — H&P (Signed)
Kathy George, Kathy George                ACCOUNT NO.:  [id number]   MEDICAL RECORD NO.:  [id number]          PATIENT TYPE:  EMS   LOCATION:  ED                           FACILITY[facility]  [facility]   PHYSICIAN:  Herbie Saxon, MDDATE OF BIRTH:  [birth year]   DATE OF ADMISSION:  07/29/2007  DATE OF DISCHARGE:                              HISTORY & PHYSICAL   PRIMARY CARE PHYSICIAN:  Talmadge Coventry, M.D.   PRESENTING COMPLAINT:  Chest pain one week duration.   HISTORY OF PRESENTING COMPLAINT:  This is a 70 year old Caucasian lady  who woke up at 6 a.m. this morning presenting with 8-9/10 severe  pressure-like substernal chest pain which radiated to her left shoulder.  There was no associated diaphoresis or dizziness.  No palpitations.  No  loss of consciousness.  The patient, however, did experience difficulty  with breathing which is still persistent at rest.  There is no cough, no  fever.  There is no history of trauma to the chest.  She was recently  put on HCTZ by her primary care physician for weight gain secondary to  fluid retention.  The patient also has history of gastroesophageal  reflux disease, and she had been having some mild chest pain the  previous week which she probable thought was due to indigestion.  No  diarrhea, constipation or abdominal distention.  No urinary symptoms.  No joint swelling.  No skin rash.  Mood is stable.  The patient has  history of hyperlipidemia, but she is not taking the anti-lipid  medication because of inability to tolerate the medication - nausea .   PAST MEDICAL HISTORY:  1. Hypertension .  2. Raynaud's disease.  3. Gastroesophageal reflux disease.  4. Hyperlipidemia.   PAST SURGICAL HISTORY:  1. Hysterectomy 40 years ago.  2. Right rotator cuff surgery.   FAMILY HISTORY:  Mother intracranial hemorrhage, chronic kidney disease  . Grandfather coronary artery disease, intracranial hemorrhage and  hypercholesterolemia.  Daughter also had  hypercholesterolemia.   SOCIAL HISTORY:  The patient drinks wine glass at least four times  weekly with meals.  No history of illicit drugs or tobacco use.   ALLERGIES:  No known drug allergies.   MEDICATIONS:  1. HCTZ 12.5 mg daily.  2. Prilosec 20 mg daily.  3. Estradiol 0.325 mg daily.   REVIEW OF SYSTEMS:  Pertinent for as above.   PHYSICAL EXAMINATION:  GENERAL:  She is an elderly lady, and she is not  in acute respiratory distress.  VITAL SIGNS:  Temperature 97, pulse 84, respirations 20, blood pressure  142/79.  HEENT:  Pupils equal, round and reactive to light and accommodation.  Oropharynx and pharynx are clear.  Normocephalic, atraumatic.  Mucosal  membranes are moist.  There is no elevated JVD or thyromegaly.  No  carotid bruit.  Some mandibular lymphadenopathy.  NECK:  Supple.  CHEST:  Clear.  CARDIAC:  Clear.  No rales or rhonchi.  Heart sounds 1 and 2 regular.  ABDOMEN:  She has truncal abilities, nontender, bowel sounds are  normoactive.  NEUROLOGICAL:  She is alert and oriented x3.  EXTREMITIES:  Peripheral pulses present no pedal edema.  NEUROLOGICAL:  Cranial nerves I-XII are intact.  Power is 5 in all  limbs.  Sensation is also normal.   LABORATORY DATA:  WBC 10.8, hematocrit 38, platelet count 182, CK MB  1.3, troponin less than 0.05.  EKG shows normal sinus rhythm at 84 per  minute.  Chemistry:  Glucose is 116, sodium 142, potassium 4.12,  chloride 101, bicarbonate 32, BUN 20, creatinine 0.8.  Chest x-ray is  pending.  D. dimer is 0.26.   ASSESSMENT:  Chest pain, rule out acute coronary syndrome, mild  hypertension, history of hyperlipidemia not on medications.  Family  history of coronary artery disease and intracranial hemorrhage.  Mild  leukocytosis, probably reactive.   PLAN:  Admit the patient to observation telemetry.  Cardiology  evaluation will be sent to [facility] for possible stress echo in the  morning.  Will cycle cardiac enzymes and EKG q.8  h x3, morphine 2 mg IV  q.6 h p.r.n., O2 2 liters nasal cannula, nitroglycerin IV q.6 h, aspirin  325 mg daily and Protonix 40 mg IV q.12 h, Lovenox 40 mg subcu daily.  Will check a thyroid function test, fasting lipids, homocysteine.  Follow up with chest x-ray, and if there is any mediastinal widening  abnormality, will consider doing a CT chest to rule out aortic aneurysm  or dissection.  Mylanta 30 cc q.4 h p.r.n. and Xanax 0.5 mg b.i.d.  Will  start her on metoprolol 25 mg daily, Altace 2.5 mg daily, keep n.p.o.  from midnight and repeat complete blood count in the morning.  She is a  full code.      Herbie Saxon, MD  Electronically Signed     MIO/MEDQ  D:  07/29/2007  T:  07/30/2007  Job:  [id number]

## 2011-03-19 NOTE — Discharge Summary (Signed)
Kathy George, Kathy George                ACCOUNT NO.:  [id number]   MEDICAL RECORD NO.:  [id number]          PATIENT TYPE:  INP   LOCATION:  3731                         FACILITY:  [facility]   PHYSICIAN:  Everardo Beals. Juanda Chance, MD, FACCDATE OF BIRTH:  10-28-1941   DATE OF ADMISSION:  05/26/2009  DATE OF DISCHARGE:  05/29/2009                               DISCHARGE SUMMARY   PRIMARY CARDIOLOGIST:  Theron Arista C. Eden Emms, MD, [facility]   PRIMARY CARE PHYSICIAN:  Ace Gins, MD   DISCHARGE DIAGNOSIS:  1. Noncardiac chest pain.  2. Thyroid nodule.      a.     A 2-cm homogeneous, primarily solid nodule in the lower pole       of the left lobe of thyroid gland.  Fine needle aspiration and       biopsy of solid nodule is recommended for size greater than 1.5       cm.   SECONDARY DIAGNOSES:  1. Coronary artery disease.      a.     Successful percutaneous coronary intervention using drug-       eluting stent to the left anterior descending, January 2009.      b.     Relook cardiac catheterization in February 2009 with patent       stent.      c.     Relook cardiac catheterization this admission, May 19, 2008, patent stents and nonobstructive coronary artery disease       with normal left ventricular function (left ventricular ejection       fraction of 60%).  2. Dyslipidemia.  3. Gastroesophageal reflux disease.  4. Right renal cyst.  5. Raynaud disease.  6. Status post thoracocentesis secondary to left pleural effusion in      2009.  7. Status post bilateral breast implants.   ALLERGIES:  NKDA.   PROCEDURES:  1. EKG, May 26, 2009, normal sinus rhythm at a rate of 60, left      anterior fascicular block.  2. Chest x-ray, May 26, 2009, no acute cardiopulmonary process.  3. EKG, May 27, 2009, normal sinus rhythm at a rate of 62, left axis      deviation, T-wave inversion in V1 and minimal inversion in lead      III, T-wave flattening in V2, V3, and V4, no other acute ST-T wave  changes, no significant Q-waves, no evidence of hypertrophy, and      delayed R-wave progression.  PR 180, QRS 86, QTc 438.  No      significant changes from prior tracing.  4. EKG, May 28, 2009, sinus bradycardia at a rate of 56, no other      significant changes from prior tracing.  5. Chest x-ray, May 28, 2009, no active cardiopulmonary disease.  6. Thyroid ultrasound, May 28, 2009, 2-cm homogeneous primarily solid      nodule in lower pole of the left lobe of the thyroid gland.      Internationally accepted guidelines recommend fine-needle      aspiration biopsy of the solid nodules  greater than 1.5 cm in size.  7. Cardiac catheterization, May 29, 2009.      a.     Coronary artery disease status post drug-eluting stent to       the left anterior descending with 0% stenosis at the stent site,       proximal left anterior descending with 40% ostial stenosis.  No       significant obstruction of the circumflex or right coronary       arteries and normal left ventricular function (left ventricular       ejection fraction of 60%).   HISTORY OF PRESENT ILLNESS:  Ms. Ozawa is a 70 year old female with  known history of CAD as described above, who has continued to have chest  pain and shortness of breath since her intervention in January 2009.  A  relook cardiac cath did not show significant obstruction and CT of her  chest showed a pleural effusion for which the patient underwent an  ultrasound-guided thoracocentesis with removal of 210 mL of fluid.  After this procedure, her chest discomfort and shortness of breath were  significantly improved.  She has been followed by Dr. Eden Emms who has  since discontinued her Plavix.  She herself stopped taking her Zocor  secondary to forgetfulness.  She was in her usual state of health (doing  quite well) until a few days prior to admission.  She walked her normal  3 miles the day before.  Her symptoms started on May 25, 2009.  She  reports  sudden onset of substernal chest discomfort, very localized,  radiating through to her back.  She states the pain was severe, she was  unable to take a deep breath, no associated symptoms.  Discomfort  continued since that time, but with decreased intensity.  The patient  reports symptoms worse when sitting up.  She also reports increased  fatigue yesterday, but slept well last night, and took a nap on date of  admission.  Called the office in the a.m., was instructed to come to the  ED for further evaluation.  At [facility] ED, the patient was started on IV  heparin, IV nitroglycerin, and O2.  At the time of evaluation by  Cardiology, the patient is still complaining of mild discomfort in her  chest.   HOSPITAL COURSE:  The patient admitted and underwent procedures as  described above.  She tolerated them well without any significant  complications.  The patient was still complaining of chest discomfort on  the morning of May 27, 2009.  The patient noted to have slight  decreased heart rate and Coreg dose adjusted.  The patient's cardiac  enzymes were negative x3 but secondary to her history and recurrent  symptoms, the patient underwent another relook cardiac cath on the  morning of May 29, 2009 (see results above).  The patient was deemed  stable for discharge after her nonobstructive disease and patent stents,  findings on cardiac cath on the afternoon of May 29, 2009.  The patient  will be instructed to follow up with her primary care Donyel Castagnola regarding  her 2-cm thyroid nodule.  She will be discharged on her decreased dose  of Coreg secondary to her slightly low heart rates during her hospital  stay.  At the time of discharge, the patient will be given her new  medication list, prescriptions, followup instructions, and post-cath  instructions.  All questions and concerns will be addressed at that  time.   NOTE:  The patient is to followup with PCP regarding possible  reinitiation of  her statin also.   DISCHARGE LABORATORY DATA:  WBC 4.3, HGB 11.1, HCT 33.2, and PLT count  154.  Sodium 135, potassium 4.0, chloride 99, CO2 30, BUN 17, creatinine  0.84, and glucose 99.  Total bilirubin 0.4, alkaline phosphatase 44, AST  19, ALT 19, total protein 6.8, albumin 3.5, and calcium 9.1.  Cardiac  enzymes negative x3.  Total cholesterol 238, triglycerides 95, HDL 59,  LDL 160, and VLDL 19.  TSH of 6.907.  Urinalysis:  Straw-colored,  otherwise within normal limits.   FOLLOWUP PLANS AND APPOINTMENTS:  1. Ace Gins (PCP), the patient to schedule in the next 1-2      weeks.  2. Dr. Charlton Haws, June 27, 2009 at 2:45 p.m.   DISCHARGE MEDICATIONS:  1. Carvedilol 6.25 mg p.o. b.i.d.  2. Enteric-coated aspirin 325 mg p.o. daily.  3. Hydrochlorothiazide 25 mg p.o. daily.  4. Omeprazole 20 mg p.o. daily.  5. Senseye eye drops 1 drop both eyes p.r.n.  6. Vitamin E 1 tablet p.o. daily.  7. Vivelle-Dot 0.0375 mg 1 patch apply twice a week.   DURATION OF DISCHARGE ENCOUNTER INCLUDING PHYSICIAN TIME:  40 minutes.      Jarrett Ables, PAC      Bruce R. Juanda Chance, MD, [facility]  Electronically Signed    MS/MEDQ  D:  05/29/2009  T:  05/30/2009  Job:  [id number]   cc:   Ace Gins, MD  [name]. Eden Emms, MD, [facility]

## 2011-03-19 NOTE — Discharge Summary (Signed)
Kathy George, Kathy George                ACCOUNT NO.:  [id number]   MEDICAL RECORD NO.:  [id number]          PATIENT TYPE:  INP   LOCATION:  4714                         FACILITY:  [facility]   PHYSICIAN:  Maple Mirza, PA   DATE OF BIRTH:  19-Jul-1941   DATE OF ADMISSION:  12/18/2007  DATE OF DISCHARGE:  12/23/2007                               DISCHARGE SUMMARY   This patient has no known drug allergies.   Time for dictation and exam greater than 40 minutes.   FINAL DIAGNOSES:  1. Admitted with recurrent symptoms.      a.     Short of breath with exertion.      b.     Fatigue.      c.     Chest discomfort radiating to the left shoulder.      d.     Persistent new onset cough.  2. Elevated D-dimer/CT of the chest showed:      a.     ENLARGING left pleural effusion.      b.     Status post ultrasound-guided left thoracentesis December 22, 2007.  3. Pleural fluid laboratories.      a.     Cytology shows benign mesothelial cells.      b.     No organisms, no growth x1 day.      c.     Glucose pleural effusion 115.      d.     Total protein 3.7.      e.     White cells 15.25.      f.     Pleural fluid lactate dehydrogenase is 71, which is high.  4. Left heart catheterization December 21, 2007, this is a repeat      catheterization:  Ejection fraction of 65%, the stent to the      proximal LAD (placed November 27, 2007) is patent; otherwise only      mild nonobstructive coronary artery disease.  5. At January catheterization the patient showed atrial arrhythmias      (multifocal atrial tachycardia versus atrial flutter).  The patient      was started on amiodarone.  This was discontinued at office visit      with Dr. Eden George December 15, 2007.  6. Metoprolol changed to Coreg at office visit with Dr. Eden George      December 15, 2007.   SECONDARY DIAGNOSES:  1. History of paroxysmal atrial fibrillation, not on chronic Coumadin      currently  2. Gastroesophageal reflux disease, hiatal  hernia.  3. Hypertension.  4. Dyslipidemia.  5. Anxiety and depression.  6. Status post hysterectomy, status post breast implants.  7. Status post Raynaud's disease.   The patient had a positive ANA this admission with titer 1:160.  This  may be of future significance.   PROCEDURE:  December 21, 2007:  Left heart catheterization, ejection  fraction 65%,  stent placed in the proximal LAD November 27, 2007 was  patent.  Otherwise mild nonobstructive coronary artery disease, ejection  fraction 65%.  BRIEF HISTORY:  Kathy George is a 70 year old female who saw Dr. Eden George and  had echocardiogram.  She was without problems.  This was a February 10  visit.  Starting February 13 she had chest pain which was exactly the  same as prior symptoms which led to PCI of the LAD.  She also has now a  persistent cough.  She has taken nitroglycerin, with just some temporary  relief.  After catheterization and intervention on her left coronary  artery on January 23 she felt better, but it was becoming more difficult  to take a deep breath.   HOSPITAL COURSE:  The patient admitted December 18, 2007 with  progressive dyspnea and chest pain on the left side.  The patient was  ruled out for acute myocardial infarction with troponin I studies which  were negative 0.01, then 0.01, then negative 0.01.  She did have an  elevated D-dimer and a CT of the chest was obtained which showed an  enlarging left pleural effusion.  She had elevated ESR 78 with a normal  range of 0-22.  Her ANA was positive, ANA titer was 1:160, anything  greater than 1:40 is high.  She underwent left ultrasound-guided  thoracentesis on February 17 with removal of 210 mL of fluid.  Fluid  assay shows that the cytology is benign mesothelial cells.  Culture of  the pleural fluid:  No organisms, no growth x1 day, white cells 1525,  regular is 0-1000.  The LDH is 71, which is also high, glucose is 115.  As regards to cardiac investigation,  after ruling out for acute  myocardial infarction the patient had left heart catheterization  February 16.  The study showed ejection fraction of 65%.  The stent at  the proximal LAD was patent.  There was a 30% ostial LAD stenosis and  30% post stent stenosis.  The right coronary artery also had 30%  stenoses in the mid point and at the bifurcation with the PDA.  The  patient had been started on Avelox this admission and will continue this  for a full course as an outpatient.  She will also have a follow-up  chest x-ray when she reports to the office of [facility] on  March 2 at 12:00 to see Dr. Eden George.  At that time pulmonary workup may  be of value.   THE PATIENT DISCHARGED ON THE FOLLOWING MEDICATIONS:  1. Aspirin 325 mg daily.  2. Plavix 75 mg daily.  3. Hydrochlorothiazide 25 mg daily.  4. Coreg 6.25 mg twice daily.  5. Zocor 40 mg at bedtime.  6. Nitroglycerin 0.4 mg 1 tablet under the tongue every 5 minutes x3      doses as needed.  7. Prilosec 20 mg to use as before this admission.  8. Estrogen patch to use as before this admission.  9. New medication potassium chloride 10 mEq daily to take with      hydrochlorothiazide to keep the potassium levels at optimal range.  10.Avelox 4 mg daily for the next 10 days, also a new medication.   She has followup with Dr. Eden George Monday, March 2 at noon.  A chest x-ray  will be obtained as well as blood work, a fasting lipid profile.  The  patient was asked to eat no breakfast prior to presentation.   Labs pertinent to the pleural effusion have been dictated above.  Other  laboratories this admission:  TSH was 3.425.  The D-dimer was 1.12.  Complete blood count on the day of discharge:  Hemoglobin 11.4,  hematocrit 34.2, platelets  of 223 and white cells 6.5.  Serum electrolytes on February 17:  Sodium  131, potassium 3.9, chloride 96, carbonate 26, BUN is 14, creatinine  0.75, glucose is 120.  Pro time this admission 14.4, INR  1.1, alkaline  phosphatase 56, SGOT 18, SGPT is 12.  Once again troponin I negative at  0.01 x3.      Maple Mirza, PA     GM/MEDQ  D:  12/23/2007  T:  12/24/2007  Job:  [id number]   cc:   Talmadge Coventry, M.D.  [name]. Kathy Emms, MD, [facility]

## 2011-03-19 NOTE — Assessment & Plan Note (Signed)
[facility]                            CARDIOLOGY OFFICE NOTE   ETHAL, GOTAY                         MRN:          [id number]  DATE:01/08/2008                            DOB:          August 23, 1941    Dodi returns in followup.  She had previous stent to the LAD.  She had  a re-look and the stent was widely patent despite atypical chest pain.  The patient continues to feel fatigued.  I explained to her that her  heart function was normal at 55%.  She should not be having any fatigue  or shortness of breath from her heart.  She is not having any more chest  pain.   She is on low-dose beta blocker and I do not think this is an issue.   The patient is able to ambulate.  She wants to return to work at the  [employer].   I think this would be fine.  She has not had any significant  palpitations, PND, orthopnea, no syncope.   She had been compliant with her meds.   REVIEW OF SYSTEMS:  Remarkable for significant fatigue.  Reviewing her  labs, she has also had a positive ANA at 1:80.  She also has had low  iron saturations of 15%.   I think she should probably have further workup for connective tissue  disease.  She did have a colonoscopy by Dr. Jarold Motto which is  apparently benign.   Her current meds include __________ 0.0375 once a week,  hydrochlorothiazide 25 a day, an aspirin a day to be decreased to 81 mg  enteric-coated, Plavix 75 a day, carvedilol 6.25 b.i.d., Zocor 40 a day,  omeprazole to be changed due to interaction with Plavix probably  switched to Protonix 40 a day, potassium 10 a day, p.r.n. nitroglycerin.   EXAM:  Remarkable for somewhat pale, elderly white female in no  distress.  Blood pressure is 116/72, pulse 63 regular, weight 153, afebrile.  Respiratory rate 14.  HEENT:  Unremarkable.  Carotids are normal without bruit, no lymphadenopathy, thyromegaly JVP  elevation.  LUNGS:  Clear good diaphragmatic motion.  No  wheezing.  S1-S2 with normal heart sounds PMI normal.  ABDOMEN:  Benign.  Bowel sounds positive.  No AAA.  No tenderness.  No  hepatosplenomegaly or hepatojugular reflux.  Distal pulses intact, no edema.  NEURO:  Nonfocal.  Skin warm and dry.  No muscular weakness.   EKG shows sinus rhythm with nonspecific ST-T wave changes, low voltage  with poor R wave progression.   IMPRESSION:  1. Coronary disease, previous stent the LAD.  Continue aspirin and      Plavix.  2. History of reflux.  Concerned about recent interaction with      omeprazole and Plavix, will switch her to Protonix, consider      stopping this sort of medicine in the future.  3. Low iron saturations, consider replacement, follow with Dr.      Jarold Motto, Dr. Smith Mince.  4. Atypical pain in the setting of a positive ANA.  Consider further  workup for connective tissue disease.  5. Hypercholesterolemia in the setting coronary disease.  Continue      Zocor 40 a day, lipid liver profile in 6 months.   I will see her back in 6 months.     [name]. Eden Emms, MD, [facility]  Electronically Signed    PCN/MedQ  DD: 01/08/2008  DT: 01/08/2008  Job #: [id number]

## 2011-03-19 NOTE — Assessment & Plan Note (Signed)
[facility]                            CARDIOLOGY OFFICE NOTE   [name], [name]                         MRN:          [id number]  DATE:12/15/2007                            DOB:          [birth year]    [name] returns today for followup.  She had chest pain.  She had a  drug eluding stent to the LAD by Dr. Juanda Chance. She had a fairly  significant post angioplasty bleed in her groin with a retroperitoneal  hematoma.  This prolonged her hospital stay a bit. She has had PAF. The  patient is not having significant chest pain. Her leg seems to have  healed well.  She is not having recurrent palpitations.  She wants to  come off as much medicine as possible when she thinks that her  metoprolol is keeping her from sleeping at night.  Her review of systems  otherwise negative.   CURRENT MEDICATIONS:  1. __________  0.0375 once a week.  2. Hydrochlorothiazide 25 a day.  3. Amiodarone 200 b.i.d.  4. Aspirin a day.  5. Metoprolol 25 b.i.d.  6. Plavix 75 a day.  7. Nexium 40 a day.   PHYSICAL EXAMINATION:  Is remarkable for healthy-appearing middle-aged  white female in no distress.  Weight is 153, blood pressure is 124/73,  pulse 52 and regular, afebrile, respiratory rate 14.  HEENT:  Unremarkable.  Carotid are normal without bruit, no  lymphadenopathy, thyromegaly or JVP elevation.  LUNGS:  Clear with good  diaphragmatic motion.  No wheezing.  S1-S2 with normal heart sounds.  PMI normal.  ABDOMEN:  Benign.  Bowel sounds positive.  No AAA; midline scar; no  bruit, no hepatosplenomegaly and no hepatojugular reflux.  Distal pulses are intact, no edema.  Cath site is well-healed with no  residual bruit or ecchymosis.  She does have chronic changes in her  fingers from Raynaud's disease.   I reviewed her echocardiogram today-time 10 minutes.  She has mild to  moderate left atrial enlargement with trivial MR and aortic valve  sclerosis.  She has good LV  function particularly in the anterior wall  with a normal EF.   IMPRESSION:  1. Coronary disease status post drug eluding stent to the left      anterior descending artery.  Continue aspirin and Plavix.  2. Paroxysmal atrial fibrillation.  The patient wants to come off      amiodarone.  She has mild to moderate left atrial enlargement.  I      do not think that she currently has indication for Coumadin.  We      will try to stop her amiodarone and see how she does.  3. Hypertension, currently well controlled.  Continue low-dose      diuretic.  4. Question side effects from metoprolol.  Given her coronary disease      and paroxysmal atrial fibrillation,  I would like to have her on a      beta blocker.  We will stop her metoprolol and try her on Coreg      6.25 b.i.d.  and see if she is able to sleep better.  5. Raynaud's disease.  She has p.r.n. nitroglycerin to take.  She does      not really want to be on long-acting nitrates.  Keep fingers and      toes warm particularly in wintertime.  6. Reflux, question interaction between Plavix and Protonix; over-the-      counter Prilosec should be fine.  Avoid spicy foods.   I will see the patient back in 6 months. At some point we will have to  take a look at her lipid profile since she has coronary disease and  currently is not on a statin.     [name]. Eden Emms, MD, [facility]  Electronically Signed    PCN/MedQ  DD: 12/15/2007  DT: 12/16/2007  Job #: [id number]   cc:   Talmadge Coventry, M.D.

## 2011-03-19 NOTE — Assessment & Plan Note (Signed)
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  She  is referred through the courtesy of Dr. Smith Mince for evaluation of her  current atypical chest pain and possible anemia.   Kathy George has Raynaud's phenomenon and has had a workup at [facility] for acid reflux, has been on PPI therapy for at least 10  years.  She most recently was on Prilosec and really doing fairly well  although she continued to have acid reflux symptoms and regurgitation.  She was admitted to [facility] recently because of acute chest  pain described as a fluttering sensation in her left precordial area  with shortness of breath and dyspnea.  She had a thorough cardiac  evaluation including 2-D echo, cardiac enzymes, lung scan, CT scan,  upper abdominal ultrasound, all of which were unremarkable.  She was  switched from Prilosec to Nexium 40 mg a day and has done well but  continues to have periodic episodes of left precordial pain and  fluttering which last 1-2 days in duration.  No real precipitated or  alleviated elements to her pain.  There is no associated dysphagia or  odynophagia.   She was initially admitted to the hospital with anemia, hemoglobin was  10.5, but repeat hemoglobin within the last 2 weeks has shown a normal  hemoglobin of 14.2 with a serum ferritin of 153.  CBC and liver function  tests were otherwise normal.  As mentioned above, she has also had upper  abdominal ultrasound that was normal except for a small right renal  cyst.   The patient denies clay colored stools, dark urine, icterus, fever,  chills, anorexia or weight loss.  She has fairly regular bowel movements  without melena or hematochezia and denies  abdominal gas or bloating.  She apparently follows a regular diet, denies any specific food  intolerances.  Despite  the fact that she has Raynaud's phenomenon she does not wear gloves.  She has had no evidence of collagen vascular disease or systemic  sclerosis.  She does have nocturnal reflux of a rather severe degree.   PAST MEDICAL HISTORY:  Otherwise is remarkable for essential  hypertension, hypercholesterolemia, she has had a previous hysterectomy,  and also bilateral breast augmentations.  She has had recent repeat  mammograms that were unremarkable.   MEDICATIONS:  1. Vivelle 0.375 mg patch a week.  2. Hydrochlorothiazide 25 mg daily.  3. Nexium 40 mg daily.  4. Claritin over-the-counter.   ALLERGIES:  She denies drug allergies.   FAMILY HISTORY:  Remarkable for ovarian cancer in her grandmother and  diabetes in an aunt.  There are no known gastrointestinal problems.   SOCIAL HISTORY:  She is single and lives with a boyfriend.  She has some  college education. Works as a Veterinary surgeon for [employer]. She  does not  smoke.  Uses ethanol socially.   REVIEW OF SYSTEMS:  Otherwise noncontributory except for a nonproductive  cough, some chronic low back pain, and chronic fatigue.  She denies  other dermatologic, neurologic, psychiatric or endocrine problems.  Review of systems otherwise noncontributory.   RECENT LAB DATA:  Showed normal metabolic profile and serum creatinine.   PHYSICAL EXAMINATION:  GENERAL:  She is an attractive, healthy-appearing  white female appearing younger than her stated age.  She is 5 feet, 4  inches tall and weighs 157 pounds.  I could not appreciate stigmata of  chronic liver disease or thyromegaly.  VITAL SIGNS:  Blood pressure 120/82 and pulse was 62 and regular.  CHEST:  Was entirely clear.  HEART:  Regular rhythm without murmurs, rubs, or gallops.  ABDOMEN:  There was no organomegaly, abdominal masses or tenderness.  EXTREMITIES:   Unremarkable.  NEURO:  Mental status was clear.  RECTAL EXAM:  Unremarkable and stool was Guaiac negative.   ASSESSMENT:  1. 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  2. Possible secondary esophageal spasm versus erosive mucosal disease      of the esophagus.  3. Need for screening colonoscopy because of her age.  4. Previous abnormal hemoglobin which was probably dilutional in      nature.  5. Status post hysterectomy.   RECOMMENDATIONS:  1. Patient education movie and literature on acid reflux.  2. Will change her to Aciphex 20 mg 30 minutes before breakfast and      supper. See how she does symptomatically since she has had some      problems in the past with Nexium.  3. Outpatient endoscopy and colonoscopy exam.  4. Consider esophageal manometry and 24 hour pH probe test depending      on clinical workup and response.  5. Continue other medications per Dr. Smith Mince.     Vania Rea. Jarold Motto, MD, [facility], FAGA  Electronically Signed    DRP/MedQ  DD: 08/28/2007  DT: 08/28/2007  Job #: [id number]

## 2011-03-19 NOTE — Cardiovascular Report (Signed)
NAMEKATURAH, KARAPETIAN                ACCOUNT NO.:  [id number]   MEDICAL RECORD NO.:  [id number]          PATIENT TYPE:  OIB   LOCATION:  NA                           FACILITY:  [facility]   PHYSICIAN:  Peter C. Eden Emms, MD, FACCDATE OF BIRTH:  1941-05-27   DATE OF PROCEDURE:  11/26/2007  DATE OF DISCHARGE:                            CARDIAC CATHETERIZATION   A 70 year old patient of Dr. Talmadge Coventry with recurrent chest  pain.   She had catheterization done with 4-French catheters in the right  femoral artery.   The left main coronary artery had 20-30% distal stenosis.   The proximal LAD had an eccentric 70-80% stenosis.  The lesion looked  moderate in all other views, however, in the LAO cranial it was clearly  eccentric and greater than 70% stenosis. The mid and distal LAD were  normal.  Circumflex coronary artery was nondominant and normal.  There  was a large obtuse marginal branch which was normal.   The right coronary artery was dominant and normal.   RAO ventriculography:  RAO ventriculography was normal.  EF was 65%.  There was no gradient across the aortic valve and no MR.  LV pressure  was 130/18.  Aortic pressure was 130/78.   IMPRESSION:  The patient will be admitted to the hospital and loaded  with Plavix.  She will be referred for PCI of the LAD with Dr. Juanda Chance  tomorrow in the inpatient lab.      [name]. Eden Emms, MD, [facility]  Electronically Signed     PCN/MEDQ  D:  11/26/2007  T:  11/26/2007  Job:  [id number]

## 2011-03-19 NOTE — Consult Note (Signed)
Kathy George, AMRHEIN                ACCOUNT NO.:  [id number]   MEDICAL RECORD NO.:  [id number]          PATIENT TYPE:  OBV   LOCATION:  0107                         FACILITY:  [facility]   PHYSICIAN:  [name]. Eden Emms, MD, FACCDATE OF BIRTH:  [birth year]   DATE OF CONSULTATION:  DATE OF DISCHARGE:                                 CONSULTATION   PRIMARY CARE PHYSICIAN:  Dr. Talmadge Coventry.   PHYSICIANS:  Primary cardiologist - she is new to [facility]  being seen by Dr. Eden Emms.   PATIENT PROFILE:  A 70 year old Caucasian female without prior history  of CAD who presents who we are asked to consult on following a seven  hour history of chest pain.   PROBLEMS:  1. Chest pain.  2. Hypertension.  3. Untreated hyperlipidemia.  4. GERD.  5. Postmenopausal.      a.     Status post hysterectomy at age 61.  101. Status post right rotator cuff repair approximately 15 years ago.   HISTORY OF PRESENT ILLNESS:  This 70 year old Caucasian female without  prior history of CAD.  She does have a history of hypertension,  hyperlipidemia, and remote tobacco abuse.  She was in her usual state of  health at approximately 6 a.m. today when she awoke with 7/10 epigastric  and left shoulder tightness without associated dyspnea or nausea or  vomiting or diaphoresis.  She did note that symptoms were worse with  deep breathing and cough, and this has persisted.  After approximately  one hour of ongoing symptoms she took two aspirin at about 7 a.m.  without relief.  She took two additional aspirin about 9 a.m. without  relief.  She eventually came into the ED at 10:30 this morning and was  treated with three sublingual nitroglycerins without effect.  This was  followed by IV morphine with some decrease in chest pain to 5/10.  She  currently says her pain remains at 5/10.  It is more centrally located  in the center of her chest.  It is now described as heavy.  She is in no  distress.  She is somewhat  fearful of taking a deep breath because her  symptoms are much worse with deep breathing and coughing.  Her first set  of cardiac markers are negative and ECG is without acute changes.   ALLERGIES:  No known drug allergies.   HOME MEDICATIONS:  1. HCTZ 25 mg daily.  2. Prilosec 20 daily.  3. Estradiol 0.325 mg 1/2 tablet daily.  4. Here she has been ordered for nitroglycerin paste 1/2 inch q.6 h.,      metoprolol 25 mg daily, Altace 2.5 mg daily, niacin 100 mg b.i.d.,      Xanax 0.5 mg b.i.d., Protonix 40 mg IV b.i.d., aspirin 325 mg      daily.   FAMILY HISTORY:  Her mother is alive at age 74 with a history of  cerebral hemorrhage in her 38s and subsequent dementia.  Her father died  of a gunshot wound at age 44.  She has a brother who is an alcoholic  and  is age 20.   SOCIAL HISTORY:  She lives in [place] with her boyfriend.  She works  in Research officer, political party.  Also works part-time at a [name].  She has  about a 10 pack-year history of tobacco abuse quitting about 10 years  ago.  She normally drinks 1-2 glasses of wine on most nights per week.  She denies any drug use and does not routinely exercise.   REVIEW OF SYSTEMS:  Positive for chest pain and history of GERD  symptoms; otherwise, all systems reviewed and negative.   PHYSICAL EXAMINATION:  VITAL SIGNS:  Temperature 97, heart rate 82,  respirations 20, blood pressure 144/86, pulse ox 100% on 2 L.  GENERAL:  Pleasant white female in no acute distress, awake, alert and oriented  x3.  HEENT:  Normal.  NEUROLOGICAL:  Grossly intact and nonfocal.  NECK:  No bruits or JVD.  LUNGS:  Respirations are unlabored, clear to auscultation.  CARDIAC:  Regular S1, S2, no S3, S4, murmurs.  ABDOMEN:  Round, soft, nontender, nondistended.  Bowel sounds present  x4.  EXTREMITIES:  Warm, dry, pink.  No clubbing, cyanosis or edema.  Dorsalis pedis, posterior tibial pulses 2+ and equal bilaterally.  There  are no femoral bruits.    Chest x-ray shows bibasilar under aeration and chronic bronchitic  changes.  EKG shows sinus rhythm at a rate of 84 beats per minute.  There are no acute ST-T changes.   LABORATORY DATA:  Hemoglobin 13.2, hematocrit 38.5, WBC 10.8, platelets  182.  Sodium 142, potassium 4.1, chloride 101, CO2 32, BUN 20,  creatinine 0.2, glucose 116.  Point of care markers are negative.  Second troponin 0.03.  Calcium 8.7. Magnesium 1.9.  INR 1.  Phosphorus  3.5.  CK-MB 1.6 with a total CK 90.   ASSESSMENT/PLAN:  1. Chest and epigastric pain.  She did have typical and atypical      features, but there is definitely a pleuritic component.  Her point      of care and first set of renal markers are currently negative and      ECG is without acute changes.  Suspect this is likely noncardiac.      I agree with observation and continue to cycle cardiac markers.      Add aspirin, nitrate, beta blocker for the time being.  If enzymes      turn positive we would add heparin and plan cardiac      catheterization.  If, however, enzymes remain negative will plan      outpatient Myoview.  Will try gastrointestinal cocktail here in the      emergency department and also will write for p.r.n. __________  .      She had agreed with obtaining echocardiogram and rule out      pericarditis, although she has some ST elevation on ECG.  2. Hypertension.  Blood pressure is currently elevated.  She is on      HCTZ at home.  If enzymes are negative, plan to resume this on      discharge.  3. Hyperlipidemia.  Check lipid and liver function tests.  Add a      statin if necessary.  The patient says her total      cholesterol is greater than 300.  She is not currently on a statin.  4. Gastroesophageal reflux disease.  Continue proton pump inhibitor.      Consider outpatient gastrointestinal follow up.  [name], ANP      [name]. Eden Emms, MD, [facility]  Electronically Signed    CB/MEDQ  D:  07/29/2007  T:   07/30/2007  Job:  [id number]

## 2011-03-19 NOTE — H&P (Signed)
Kathy George, Kathy George                ACCOUNT NO.:  [id number]   MEDICAL RECORD NO.:  [id number]          PATIENT TYPE:  OBV   LOCATION:  3731                         FACILITY:  [facility]   PHYSICIAN:  Kathy Rotunda, MD, FACCDATE OF BIRTH:  Apr 01, 1941   DATE OF ADMISSION:  05/26/2009  DATE OF DISCHARGE:                              HISTORY & PHYSICAL   PRIMARY CARDIOLOGIST:  Kathy Arista C. Eden Emms, MD, [facility]   [name] is a very pleasant young active 70 year old female who  routinely walks 3 miles a day and has done so throughout the summer.  She has a history of coronary artery disease, status post IVUS-guided  PCI of the lesion in the proximal LAD using a PROMUS drug-eluting stent  in January 2009.  The patient had a followup catheterization in February  2009 for ongoing chest discomfort.  At that time, she was found to have  a patent stent.  There was a tubular 30% lesion in the midsection after  the stent, showed normal LV function.  Her chest pain was felt not to be  noncardiac at that time.  She continued to have problems with shortness  of breath and a CT of the chest was obtained that ruled in a left  pleural effusion and the patient underwent an ultrasound-guided left  thoracentesis, she will be in 210 mL of fluid.  She states that seemed  to relieve her chest discomfort more than anything and she did fine  after that.  She is followed up with Dr. Eden George.  Her Plavix was  discontinued.  The patient discontinued her Zocor, states she does not  remember to take it in the evening.  She has been doing quite well until  the last few days.  She walked her normal 3 miles the day before  yesterday.  Yesterday, she done some errands with her grandchild and  then after she came back home, she had a sudden onset of substernal  chest discomfort, very localized radiating through to her back.  She  states she was so painful, was unable to take a deep breath.  She had no  other associated symptoms.   The discomfort has continued since that  time, not quite as intense.  She states it is worse when she sits up.  She felt more fatigued yesterday and actually slept all night and took a  nap today.  She called the office this morning and described her  symptoms and was instructed to come in to get evaluated.  Here in the  ER, she has been given a heparin bolus, a drip initiated, a  nitroglycerin drip and O2.  She currently says she is aware of the  discomfort in her chest, but is not as intense.   PAST MEDICAL HISTORY:  1. Coronary artery disease, drug-eluting stent to the LAD in January      2009, relook cath in February 2009 with patent stent.  2. Status post bilateral breast implants.  3. 2-D echocardiogram in 2009 showing EF of 55 with trivial tricuspid      regurgitation.  4. Right renal  cyst.  5. Small thyroid nodule noted by CT in February 2009.  6. GERD.  7. Raynaud disease.  8. Dyslipidemia.  9. Status post thoracentesis secondary to left pleural effusion in      2009.   SOCIAL HISTORY:  The patient lives in [place] with her boyfriend.  She works in a [employer].  She is widow.  She has remote history of  tobacco use.  For exercise, she works 3 miles a day.  She has occasional  glass of wine, tries to follow her heart-healthy diet.  Denies any  illicit substance use.   FAMILY HISTORY:  Mother deceased from intracranial hemorrhage.  She also  had chronic kidney disease.  Father deceased at age 39 from a gunshot  wound.  She has a sibling who is an alcoholic.   REVIEW OF SYSTEMS:  Positive for chest pain as described above and  occasional  GERD symptoms.  Shortness of breath and discomfort radiating  through back.  All other systems are reviewed and negative.   ALLERGIES:  No known drug allergies.   MEDICATIONS:  1. Hydrochlorothiazide 25.  2. Coreg 6.25 b.i.d.  3. Aspirin 325.  4. Prilosec b.i.d.  5. The patient states she is currently on amoxicillin for  urinary      tract infection, but does not know the dose.  She states she      stopped her Zocor.   PHYSICAL EXAMINATION:  VITAL SIGNS:  Temperature 98.1, heart rate 62,  respirations 16, blood pressure 138/70, sat 97% on room air.  GENERAL:  No acute distress, very pleasant well-maintained female.  HEENT:  Unremarkable.  NECK:  Supple without bruits or JVD.  CARDIOVASCULAR:  S1 and S2, regular rate and rhythm.  LUNGS:  Clear to auscultation bilaterally.  SKIN:  Warm and dry.  ABDOMEN:  Soft, nontender, positive bowel sounds.  LOWER EXTREMITIES:  Without clubbing, cyanosis, or edema.  NEUROLOGIC:  Alert and oriented x3.   Chest x-ray; right-sided breast implant noted heart size is normal.  Lungs are clear.  No pleural effusion.  No acute bony findings.  A 12-  lead EKG showing sinus rhythm with left anterior fascicular block at a  rate of 60.  Lab work; cardiac enzymes negative x1 set.  All other labs  are pending.   IMPRESSION:  Chest pain, somewhat atypical features in the setting of  previous single-vessel disease and patent stent 2009, also with history  of pleural effusion, status post thoracentesis.  The patient will be  admitted for observation, cycle cardiac markers, repeat 12-lead EKG in  the a.m.  We will also reevaluate thyroid nodule here.  If enzymes are  negative, the patient can be discharged tomorrow, follow up with a  stress test at the office.  Dr. Rollene George has been in to examine  and assess, the patient agrees with plan of care.      Kathy George, ACNP      Kathy Rotunda, MD, [facility]  Electronically Signed    MB/MEDQ  D:  05/26/2009  T:  05/27/2009  Job:  [id number]

## 2011-03-19 NOTE — Discharge Summary (Signed)
NAMECHARLENE, [name]                ACCOUNT NO.:  [id number]   MEDICAL RECORD NO.:  [id number]          PATIENT TYPE:  INP   LOCATION:  3731                         FACILITY:  [facility]   PHYSICIAN:  Everardo Beals. Juanda Chance, MD, FACCDATE OF BIRTH:  [birth year]   DATE OF ADMISSION:  05/26/2009  DATE OF DISCHARGE:  05/29/2009                               DISCHARGE SUMMARY   ADDENDUM   The patient will not continue on omeprazole and instead will change to  Nexium (esomeprazole) 20 mg p.o. daily and will restart her simvastatin  40 mg p.o. daily.  These changes were discussed with the patient and she  agrees to comply.      Jarrett Ables, PAC    ______________________________  Everardo Beals Juanda Chance, MD, [facility]    MS/MEDQ  D:  05/29/2009  T:  05/30/2009  Job:  [id number]

## 2011-03-19 NOTE — H&P (Signed)
Kathy George, Kathy George                ACCOUNT NO.:  [id number]   MEDICAL RECORD NO.:  [id number]          PATIENT TYPE:  INP   LOCATION:  4714                         FACILITY:  [facility]   PHYSICIAN:  Bevelyn Buckles. Bensimhon, MDDATE OF BIRTH:  [birth year]   DATE OF ADMISSION:  12/18/2007  DATE OF DISCHARGE:                              HISTORY & PHYSICAL   PRIMARY CARDIOLOGIST:  Theron Arista C. Eden Emms, MD, [facility].   PRIMARY CARE PHYSICIAN:  Talmadge Coventry, M.D.   HISTORY OF PRESENT ILLNESS:  Ms. Buckalew is a very pleasant, 70 year old  Caucasian female with known history of coronary artery disease status  post cardiac catheterization with drug-eluting stent to the LAD in  January of this year.  She states for a week or so she felt really good  after her cath, but then gradually began to just feel more fatigued and  short of breath with minimal exertion.  Ms. Estabrook is normally a very  active female who walks and works out at [employer].  She states she saw Dr.  Eden Emms on Tuesday and had an echocardiogram done.  No problems at that  time.  Did feel a little bad that day, but no really definite  symptoms, but Wednesday she began having chest discomfort that radiated  up into her left shoulder.  She states exactly same as prior to her  catheterization and intervention in January.  She also has developed a  cough.  She states the same thing happened before her cath in January.  Today she was walking across the parking lot after eating, experienced  chest discomfort, took nitroglycerin with relief temporarily and then it  returned.  She called our office and was told to come to the emergency  room for further evaluation.  Here she has been evaluated by the ER  doctor.  Initially her first point of cares are negative.  EKG without  acute changes.  She has some nonspecific T-wave changes in lateral  leads, is in no acute distress at the present time.  When she saw Dr.  Eden Emms on Tuesday, her amiodarone was  stopped.  She had been on that for  an episode of paroxysmal atrial fib previously.  She states compliance  with her Plavix and aspirin, and no other changes in status.   ALLERGIES:  NO KNOWN DRUG ALLERGIES.   CURRENT MEDICATIONS:  1. HCTZ 25.  2. Prilosec or Nexium.  3. Estrogen patch.  4. Coreg 6.25 b.i.d.  5. Aspirin 325.  6. Plavix 75.   PAST MEDICAL HISTORY:  1. Raynaud disease.  2. Hypertension.  3. GERD.  4. Hiatal hernia.  5. Dyslipidemia.  6. Very remote history of tobacco use.  7. Coronary artery disease, status post drug-eluting stent to the LAD.  8. Paroxysmal atrial fibrillation.  9. Anxiety/depression.  10.Hysterectomy at age 19.  11.Breast implants greater than 40 years ago.   SOCIAL HISTORY:  She lives in [place] with her boyfriend.  She works  in Research officer, political party.  Denies any tobacco use.  Occasionally has a glass of  wine with dinner.  Denies any recreational  substances.  Follows a heart  healthy diet.  For exercise she walks or works out at [employer]   FAMILY HISTORY:  Mother with history of intracranial hemorrhage, kidney  disease, and dementia.  Father deceased at age 24 secondary to gunshot  wound.  Siblings with known history of EtOH abuse.   REVIEW OF SYSTEMS:  Positive for chest pain, shortness of breath,  dyspnea on exertion, cough, and nausea.  All other systems reviewed and  negative.   PHYSICAL EXAMINATION:  VITAL SIGNS:  Temperature 99.3, pulse 77,  respirations 20, blood pressure 140/66, saturation 100%  on 2 liters.  GENERAL:  No acute distress.  HEENT:  Normal.  NECK:  Supple without lymphadenopathy, bruit, or JVD.  CARDIOVASCULAR:  S1 and S2.  Regular rate and rhythm.  LUNGS:  Clear to auscultation.  SKIN:  Warm and dry.  Her right upper extremity cyanosis noted (history  of Raynaud disease).  ABDOMEN:  Soft, nontender, positive bowel sounds.  LOWER EXTREMITIES:  Without clubbing, cyanosis.  She does have some  edema to the right lower  leg that is nonpitting.  NEUROLOGICAL:  Alert and oriented x3.   DATA:  Chest x-ray showing left pleural effusion, questionable etiology,  cardiomegaly, mild pulmonary vascular prominence.  EKG:  Sinus rhythm.  Hemoglobin and hematocrit 12.1 and 36.  WBCs 9.9, platelets 223,000.  Sodium 130, potassium 3.5, chloride 95, BUN 16, creatinine 1, glucose  115. Point of care, first set, is negative.   IMPRESSION:  Chest pain suggestive of angina.  Will admit.  Plan on  cardiac catheterization Monday.  Also with increased dyspnea, check D-  dimer, PA and lateral chest x-ray for evaluation of pulmonary effusion,  low threshold for a noncontrast CT for further evaluation of the  effusion.  Also check an ANA.  Dr. Nicholes Mango in to examine and  assess the patient and agrees with plan of care.      Dorian Pod, ACNP      Bevelyn Buckles. Bensimhon, MD  Electronically Signed    MB/MEDQ  D:  12/18/2007  T:  12/21/2007  Job:  [id number]   cc:   Talmadge Coventry, M.D.

## 2011-06-05 ENCOUNTER — Ambulatory Visit (HOSPITAL_COMMUNITY)
Admission: RE | Admit: 2011-06-05 | Discharge: 2011-06-05 | Disposition: A | Discharge status: Home / Self Care | Payer: Medicare Other | Source: Ambulatory Visit | Attending: Rheumatology | Admitting: Rheumatology

## 2011-06-05 DIAGNOSIS — R0989 Other specified symptoms and signs involving the circulatory and respiratory systems: Secondary | ICD-10-CM | POA: Insufficient documentation

## 2011-06-05 DIAGNOSIS — R0609 Other forms of dyspnea: Secondary | ICD-10-CM | POA: Insufficient documentation

## 2011-06-05 LAB — PULMONARY FUNCTION TEST

## 2011-07-25 LAB — CK TOTAL AND CKMB (NOT AT ARMC)
Relative Index: INVALID
Total CK: 44

## 2011-07-25 LAB — BASIC METABOLIC PANEL
BUN: 13
CO2: 27
Calcium: 8.7
Chloride: 101
Creatinine, Ser: 0.84
Creatinine, Ser: 0.84
GFR calc Af Amer: >60
Glucose, Bld: 91

## 2011-07-25 LAB — CBC
MCHC: 33.8
MCHC: 34.1
MCHC: 34.6
Platelets: 252
RBC: 3.56 — ABNORMAL LOW
RBC: 3.57 — ABNORMAL LOW
RDW: 14.8
WBC: 7.7

## 2011-07-25 LAB — CROSSMATCH
ABO/RH(D): A POS
Antibody Screen: NEGATIVE

## 2011-07-25 LAB — HEMOGLOBIN AND HEMATOCRIT, BLOOD
HCT: 31.7 — ABNORMAL LOW
Hemoglobin: 10.8 — ABNORMAL LOW

## 2011-07-26 LAB — CBC
HCT: 34.2 — ABNORMAL LOW
Hemoglobin: 12.1
Hemoglobin: 10.9 — ABNORMAL LOW
Hemoglobin: 11.4 — ABNORMAL LOW
MCHC: 33.7
MCHC: 34.1
MCHC: 34.3
MCV: 85.2
MCV: 85.5
MCV: 85.7
MCV: 85.8
Platelets: 169
Platelets: 174
Platelets: 223
RBC: 4.22
RDW: 15.9 — ABNORMAL HIGH
RDW: 15.9 — ABNORMAL HIGH
WBC: 5.9
WBC: 6.5

## 2011-07-26 LAB — DIFFERENTIAL
Basophils Relative: 0
Basophils Relative: 0
Basophils Relative: 1
Eosinophils Absolute: 0.1
Eosinophils Absolute: 0.1
Eosinophils Absolute: 0.2
Lymphs Abs: 1.2
Lymphs Abs: 1.4
Monocytes Absolute: 0.7
Monocytes Absolute: 0.6
Monocytes Relative: 7
Monocytes Relative: 10
Neutro Abs: 3.3
Neutrophils Relative %: 56
Neutrophils Relative %: 63

## 2011-07-26 LAB — CK TOTAL AND CKMB (NOT AT ARMC)
CK, MB: 0.4
Total CK: 44

## 2011-07-26 LAB — BODY FLUID CULTURE: Culture: NO GROWTH

## 2011-07-26 LAB — COMPREHENSIVE METABOLIC PANEL
ALT: 12
Albumin: 2.7 — ABNORMAL LOW
Alkaline Phosphatase: 56
Calcium: 8.5
GFR calc Af Amer: >60
Glucose, Bld: 94
Potassium: 3.6
Sodium: 135
Total Protein: 5.9 — ABNORMAL LOW

## 2011-07-26 LAB — PROTIME-INR
INR: 1.1
INR: 1.1
Prothrombin Time: 14.4

## 2011-07-26 LAB — ANTI-NUCLEAR AB-TITER (ANA TITER)
ANA Titer 1: 1:160 {titer} — ABNORMAL HIGH
ANA Titer 1: 1:160 {titer} — ABNORMAL HIGH

## 2011-07-26 LAB — BASIC METABOLIC PANEL
BUN: 14
BUN: 9
CO2: 27
CO2: 28
Calcium: 8 — ABNORMAL LOW
Calcium: 8.5
Chloride: 97
Chloride: 96
Creatinine, Ser: 0.85
GFR calc Af Amer: >60
Glucose, Bld: 101 — ABNORMAL HIGH
Glucose, Bld: 120 — ABNORMAL HIGH
Glucose, Bld: 90
Potassium: 3.9
Sodium: 129 — ABNORMAL LOW
Sodium: 131 — ABNORMAL LOW

## 2011-07-26 LAB — BODY FLUID CELL COUNT WITH DIFFERENTIAL
Lymphs, Fluid: 4
Other Cells, Fluid: 0

## 2011-07-26 LAB — CARDIAC PANEL(CRET KIN+CKTOT+MB+TROPI)
CK, MB: 0.5
Relative Index: INVALID
Relative Index: INVALID
Total CK: 42
Total CK: 42
Troponin I: 0.01
Troponin I: <0.01

## 2011-07-26 LAB — I-STAT 8, (EC8 V) (CONVERTED LAB)
BUN: 16
Bicarbonate: 29.1 — ABNORMAL HIGH
Glucose, Bld: 115 — ABNORMAL HIGH
Potassium: 3.5
TCO2: 31
pH, Ven: 7.371 — ABNORMAL HIGH

## 2011-07-26 LAB — APTT
aPTT: 49 — ABNORMAL HIGH
aPTT: 41 — ABNORMAL HIGH

## 2011-07-26 LAB — POCT I-STAT CREATININE: Creatinine, Ser: 1

## 2011-07-26 LAB — LACTATE DEHYDROGENASE, PLEURAL OR PERITONEAL FLUID: LD, Fluid: 71 — ABNORMAL HIGH

## 2011-07-26 LAB — LIPID PANEL
Total CHOL/HDL Ratio: 3.3
VLDL: 11

## 2011-07-26 LAB — D-DIMER, QUANTITATIVE: D-Dimer, Quant: 1.12 — ABNORMAL HIGH

## 2011-07-26 LAB — PH, BODY FLUID

## 2011-07-26 LAB — POCT CARDIAC MARKERS
Myoglobin, poc: 22.7
Operator id: 288831

## 2011-07-26 LAB — PROTEIN, BODY FLUID: Total protein, fluid: 3.7

## 2011-07-26 LAB — PATHOLOGIST SMEAR REVIEW

## 2011-07-26 LAB — ANA: Anti Nuclear Antibody(ANA): POSITIVE — AB

## 2011-08-02 ENCOUNTER — Encounter: Payer: Self-pay | Admitting: Cardiovascular Disease

## 2011-08-05 ENCOUNTER — Encounter: Payer: Self-pay | Admitting: Cardiovascular Disease

## 2011-08-05 ENCOUNTER — Ambulatory Visit (INDEPENDENT_AMBULATORY_CARE_PROVIDER_SITE_OTHER): Payer: Medicare Other | Admitting: Cardiovascular Disease

## 2011-08-05 DIAGNOSIS — I10 Essential (primary) hypertension: Secondary | ICD-10-CM

## 2011-08-05 DIAGNOSIS — R609 Edema, unspecified: Secondary | ICD-10-CM

## 2011-08-05 DIAGNOSIS — I2510 Atherosclerotic heart disease of native coronary artery without angina pectoris: Secondary | ICD-10-CM

## 2011-08-05 DIAGNOSIS — I4891 Unspecified atrial fibrillation: Secondary | ICD-10-CM

## 2011-08-05 DIAGNOSIS — E785 Hyperlipidemia, unspecified: Secondary | ICD-10-CM

## 2011-08-05 NOTE — Assessment & Plan Note (Signed)
Continue low dose diuretic and support hose.  Low sodium diet

## 2011-08-05 NOTE — Assessment & Plan Note (Signed)
Continue low dose statin.  Mild pain in feet Labs this Friday

## 2011-08-05 NOTE — Assessment & Plan Note (Signed)
Maint NSR with no palpitations  

## 2011-08-05 NOTE — Assessment & Plan Note (Addendum)
Stable with no angina and good activity level.  Continue medical Rx ASA and beta blocker 

## 2011-08-05 NOTE — Patient Instructions (Signed)
Your physician wants you to follow-up in: 1 year with Dr. Nishan.  You will receive a reminder letter in the mail two months in advance. If you don't receive a letter, please call our office to schedule the follow-up appointment.  

## 2011-08-05 NOTE — Assessment & Plan Note (Signed)
Borderline.  Suggested taking diuretic daily.  Monitor at home Add ACE if high

## 2011-08-05 NOTE — Progress Notes (Signed)
Kathy George returns in followup. She had previous stent to the LAD. She hada re-look and the stent was widely patent despite atypical chest pain.The patient continues to feel fatigued. I explained to her that herheart function was normal at 55%. She should not be having any fatigueor shortness of breath from her heart. She is not having any more chestpain. She needs to start exercising again. She is on a lower dose of HCTZ and crestor now. She has some plantar fascitis that is bothering her but otherwise nothing new Sees Anderson for scleroderma.  He ordered an echo at [facility] for ? Dyspnea.  Need to get results but patient indicates ok.  Mild LE edema.  Dependant.  Discussed low sodium diet and support hose  Runny nose  Ok to take Claritin.  Needs lab work.  Seeing Dr Duanne Guess as new primary on Friday.  Suggested routine labs including LFT;s and cholesterol  ROS: Denies fever, malais, weight loss, blurry vision, decreased visual acuity, cough, sputum, SOB, hemoptysis, pleuritic pain, palpitaitons, heartburn, abdominal pain, melena, lower extremity edema, claudication, or rash.  All other systems reviewed and negative  General: Affect appropriate Healthy:  appears stated age HEENT: normal Neck supple with no adenopathy JVP normal no bruits no thyromegaly Lungs clear with no wheezing and good diaphragmatic motion Heart:  S1/S2 no murmur,rub, gallop or click PMI normal Abdomen: benighn, BS positve, no tenderness, no AAA no bruit.  No HSM or HJR Distal pulses intact with no bruits Trace edema and mild bilateral varicosities Neuro non-focal Skin warm and dry No muscular weakness   Current Outpatient Prescriptions  Medication Sig Dispense Refill  . aspirin 325 MG tablet Take 325 mg by mouth daily.        . carvedilol (COREG) 6.25 MG tablet TAKE 1 TABLET TWICE A DAY  60 tablet  10  . cetirizine (ZYRTEC) 10 MG tablet Take 10 mg by mouth daily.        . Coenzyme Q10 (COQ10) 200 MG CAPS Take by mouth.         . estradiol (VIVELLE-DOT) 0.0375 MG/24HR Place 1 patch onto the skin 2 (two) times a week.        . hydrochlorothiazide (HYDRODIURIL) 25 MG tablet Take 25 mg by mouth daily.        Marland Kitchen ibuprofen (ADVIL,MOTRIN) 200 MG tablet Take 200 mg by mouth every 6 (six) hours as needed.        . nitroGLYCERIN (NITROSTAT) 0.4 MG SL tablet Place 0.4 mg under the tongue every 5 (five) minutes as needed.        Marland Kitchen omeprazole (PRILOSEC) 20 MG capsule Take 20 mg by mouth daily.        Bertram Gala Glycol-Propyl Glycol (SYSTANE) 0.4-0.3 % SOLN Apply to eye.        . rosuvastatin (CRESTOR) 5 MG tablet Take 5 mg by mouth daily.          Allergies  Review of patient's allergies indicates no known allergies.  Electrocardiogram:  NSR 56 low voltage LAFB poor R wave progression  Assessment and Plan

## 2011-08-15 LAB — COMPREHENSIVE METABOLIC PANEL
ALT: 17
Alkaline Phosphatase: 38 — ABNORMAL LOW
CO2: 28
Chloride: 99
GFR calc non Af Amer: 56 — ABNORMAL LOW
Glucose, Bld: 116 — ABNORMAL HIGH
Potassium: 3.5
Sodium: 132 — ABNORMAL LOW
Total Bilirubin: 0.7

## 2011-08-15 LAB — CK TOTAL AND CKMB (NOT AT ARMC)
CK, MB: 1.2
CK, MB: 1.3
CK, MB: 1.6
Relative Index: INVALID
Total CK: 69
Total CK: 76
Total CK: 90

## 2011-08-15 LAB — URINE CULTURE: Colony Count: 7000

## 2011-08-15 LAB — CALCIUM: Calcium: 8.7

## 2011-08-15 LAB — TSH: TSH: 1.915

## 2011-08-15 LAB — CBC
HCT: 28.2 — ABNORMAL LOW
HCT: 31.5 — ABNORMAL LOW
HCT: 38.5
Hemoglobin: 10.6 — ABNORMAL LOW
Hemoglobin: 13.2
Hemoglobin: 9.5 — ABNORMAL LOW
MCHC: 33.8
MCV: 88
MCV: 88.3
Platelets: 141 — ABNORMAL LOW
RBC: 3.17 — ABNORMAL LOW
RBC: 3.58 — ABNORMAL LOW
WBC: 10.8 — ABNORMAL HIGH
WBC: 6.7
WBC: 9.9

## 2011-08-15 LAB — POCT CARDIAC MARKERS
CKMB, poc: 1.3
Myoglobin, poc: 45.6
Operator id: 1627

## 2011-08-15 LAB — LIPID PANEL
Total CHOL/HDL Ratio: 2.8
VLDL: 8

## 2011-08-15 LAB — BASIC METABOLIC PANEL
Calcium: 9.8
GFR calc Af Amer: >60
GFR calc non Af Amer: >60
Potassium: 4.1
Sodium: 142

## 2011-08-15 LAB — HOMOCYSTEINE: Homocysteine: 11.3

## 2011-08-15 LAB — PHOSPHORUS: Phosphorus: 3.5

## 2011-08-15 LAB — TROPONIN I
Troponin I: 0.02
Troponin I: 0.03

## 2011-08-15 LAB — PROTIME-INR: Prothrombin Time: 13.7

## 2011-08-19 ENCOUNTER — Encounter: Payer: Self-pay | Admitting: Cardiovascular Disease

## 2012-01-17 ENCOUNTER — Ambulatory Visit (INDEPENDENT_AMBULATORY_CARE_PROVIDER_SITE_OTHER)
Admission: RE | Admit: 2012-01-17 | Discharge: 2012-01-17 | Disposition: A | Discharge status: Home / Self Care | Payer: Medicare Other | Source: Ambulatory Visit | Attending: Pulmonary Disease | Admitting: Pulmonary Disease

## 2012-01-17 ENCOUNTER — Ambulatory Visit (INDEPENDENT_AMBULATORY_CARE_PROVIDER_SITE_OTHER): Payer: Medicare Other | Admitting: Pulmonary Disease

## 2012-01-17 ENCOUNTER — Encounter: Payer: Self-pay | Admitting: Pulmonary Disease

## 2012-01-17 VITALS — BP 118/72 | HR 56 | Temp 97.7°F | Ht 64.0 in | Wt 158.2 lb

## 2012-01-17 DIAGNOSIS — R059 Cough, unspecified: Secondary | ICD-10-CM | POA: Insufficient documentation

## 2012-01-17 DIAGNOSIS — R05 Cough: Secondary | ICD-10-CM | POA: Insufficient documentation

## 2012-01-17 DIAGNOSIS — M349 Systemic sclerosis, unspecified: Secondary | ICD-10-CM

## 2012-01-17 NOTE — Assessment & Plan Note (Signed)
The patient has a history of an intermittent dry hacking cough that I suspect is related to reflux disease.  She also has some rhinorrhea, and therefore postnasal drip may be an issue as well.  I have asked her to continue on her protime pump inhibitor regimen compliantly, and to be more careful with reflux precautions especially at night when lying down.  We'll also check a chest x-ray to make sure she does not have evidence for chronic aspiration or interstitial lung disease.

## 2012-01-17 NOTE — Progress Notes (Signed)
  Subjective:    Patient ID: Kathy George, female    DOB: 02-09-1941, 71 y.o.   MRN: [id number]  HPI The patient comes in today for pulmonary evaluation.  She has a history of scleroderma with Raynaud, and questions been raised whether she may have concomitant pulmonary disease.  The patient feels that her exertional tolerance is fairly good, and she only has shortness of breath with heavy exercise or heavy exertional activities.  She has no breathing issues while doing activities of daily living such as grocery shopping, housework, etc.  She does have a nagging hacking cough at times, but describes rhinorrhea.  She also has a history of significant GERD, and although she is on a proton pump inhibitor she does not stick to a reflux diet.  She has worsening reflux symptoms at night, especially when lying down.  She has had episodes of acid regurgitation all the way up into her throat.  The patient has not had a recent true chest x-ray, and she did have spirometry last summer but appeared normal.  She has not had lung volumes or diffusion capacity.  She had an echo last year that showed mild diastolic dysfunction, but no evidence for pulmonary hypertension.   Review of Systems  Constitutional: Negative for fever and unexpected weight change.  HENT: Negative for ear pain, nosebleeds, congestion, sore throat, rhinorrhea, sneezing, trouble swallowing, dental problem, postnasal drip and sinus pressure.   Eyes: Negative for redness and itching.  Respiratory: Positive for cough. Negative for chest tightness, shortness of breath and wheezing.   Cardiovascular: Negative for palpitations and leg swelling.  Gastrointestinal: Negative for nausea and vomiting.  Genitourinary: Negative for dysuria.  Musculoskeletal: Negative for joint swelling.  Skin: Negative for rash.  Neurological: Negative for headaches.  Hematological: Does not bruise/bleed easily.  Psychiatric/Behavioral: Negative for dysphoric mood. The  patient is not nervous/anxious.        Objective:   Physical Exam Constitutional:  Well developed, no acute distress  HENT:  Nares patent without discharge, mild deviation to left  Oropharynx without exudate, palate and uvula are normal  Eyes:  Perrla, eomi, no scleral icterus  Neck:  No JVD, no TMG  Cardiovascular:  Normal rate, regular rhythm, no rubs or gallops.  No murmurs        Intact distal pulses  Pulmonary :  Normal breath sounds, no stridor or respiratory distress   No rales, rhonchi, or wheezing  Abdominal:  Soft, nondistended, bowel sounds present.  No tenderness noted.   Musculoskeletal:  No lower extremity edema noted.  Lymph Nodes:  No cervical lymphadenopathy noted  Skin:  No cyanosis noted  Neurologic:  Alert, appropriate, moves all 4 extremities without obvious deficit.         Assessment & Plan:

## 2012-01-17 NOTE — Assessment & Plan Note (Signed)
The patient has a history of scleroderma, and therefore is at significant risk for developing interstitial lung disease or scarring related to chronic aspiration.  The patient really does not have dyspnea that is out of proportion to her degree of activity, and I suspect her cough is more related to reflux and postnasal drip.  She has no crackles on lung exam today.  I do think that she needs to have a yearly chest x-ray, and would like to do pulmonary function studies which include lung volumes and diffusion capacity.  If she has abnormalities in these parameters, she will need a high resolution CT chest.  She will also need periodic echocardiograms to evaluate for pulmonary hypertension.

## 2012-01-17 NOTE — Patient Instructions (Signed)
Will check cxr today, and call you with results. Will setup for breathing studies, and call you with results. Continue with reflux precautions.  If everything looks ok, would like to see you back in one year or sooner if having breathing issues

## 2012-02-12 ENCOUNTER — Ambulatory Visit (INDEPENDENT_AMBULATORY_CARE_PROVIDER_SITE_OTHER): Payer: Medicare Other | Admitting: Pulmonary Disease

## 2012-02-12 DIAGNOSIS — R05 Cough: Secondary | ICD-10-CM

## 2012-02-12 DIAGNOSIS — M349 Systemic sclerosis, unspecified: Secondary | ICD-10-CM

## 2012-02-12 LAB — PULMONARY FUNCTION TEST

## 2012-02-12 NOTE — Progress Notes (Signed)
PFT done today. 

## 2012-02-17 ENCOUNTER — Telehealth: Payer: Self-pay | Admitting: Pulmonary Disease

## 2012-02-17 NOTE — Telephone Encounter (Signed)
Please let the pt know that her breathing tests look pretty good.  Her cxr was ok as well Will see her back in one year, but she is to call if she sees a significant change in her breathing.

## 2012-02-18 NOTE — Telephone Encounter (Signed)
Pt notified of PFT and CXR results per Dr. Shelle Iron and verbalized understanding.

## 2012-02-19 ENCOUNTER — Other Ambulatory Visit: Payer: Self-pay | Admitting: Cardiovascular Disease

## 2012-02-21 ENCOUNTER — Encounter: Payer: Self-pay | Admitting: Pulmonary Disease

## 2012-06-18 ENCOUNTER — Other Ambulatory Visit: Payer: Self-pay | Admitting: Family Medicine

## 2012-06-18 DIAGNOSIS — Z1231 Encounter for screening mammogram for malignant neoplasm of breast: Secondary | ICD-10-CM

## 2012-06-24 ENCOUNTER — Ambulatory Visit: Payer: Medicare Other

## 2012-06-24 ENCOUNTER — Ambulatory Visit
Admission: RE | Admit: 2012-06-24 | Discharge: 2012-06-24 | Disposition: A | Discharge status: Home / Self Care | Payer: Medicare Other | Source: Ambulatory Visit | Attending: Family Medicine | Admitting: Family Medicine

## 2012-06-24 DIAGNOSIS — Z1231 Encounter for screening mammogram for malignant neoplasm of breast: Secondary | ICD-10-CM

## 2013-03-12 ENCOUNTER — Other Ambulatory Visit: Payer: Self-pay | Admitting: Dermatology

## 2013-04-15 ENCOUNTER — Other Ambulatory Visit: Payer: Self-pay | Admitting: Dermatology

## 2013-10-11 ENCOUNTER — Telehealth: Payer: Self-pay | Admitting: Cardiovascular Disease

## 2013-10-11 ENCOUNTER — Ambulatory Visit (INDEPENDENT_AMBULATORY_CARE_PROVIDER_SITE_OTHER): Payer: Medicare Other | Admitting: Nurse Practitioner

## 2013-10-11 ENCOUNTER — Encounter: Payer: Self-pay | Admitting: Cardiovascular Disease

## 2013-10-11 VITALS — BP 128/68 | HR 58 | Resp 18

## 2013-10-11 DIAGNOSIS — R079 Chest pain, unspecified: Secondary | ICD-10-CM

## 2013-10-11 NOTE — Telephone Encounter (Signed)
Pt has elbow pain/ left arm for 2 day, pt has CAD/stent.  Pt has tried nitro and had some brief relief from pain. Pt had nausea x 1 morning.  Pain is a constant ache, pt feeling apprehensive about the pain. ekg app made for today/ nurse visit and an app for Dr Eden Emms Wednesday. Pt was accepting of both appointments.

## 2013-10-11 NOTE — Telephone Encounter (Signed)
New message    Pt states she was disconnected when she called earlier---c/o arm pain only and she was transferred to triage when she was disconnected.

## 2013-10-11 NOTE — Progress Notes (Signed)
Pt was nurse visit today for c/o left lower arm/elbow pain since Saturday 1PM Took nitro with relief, pain returned Sunday 3am.  Again took nitro with relief.  Pt has a round raised area on anticubital that she asks if may be a blood clot.  Area is tender to touch, no warmth or discoloration noted. Also reports "pain over my heart" on/off. One episode of being nauseated, no vomiting. No dizziness, lightheadedness. No SOB Currently pain is at left elbow and inner forearm. Skin warm, dry.  EKG completed, reviewed and compared to previous EKG from Oct 2012 by Dr. Eden Emms. Per Dr. Eden Emms, explained to patient that if she develops worsening/prolonged symptoms she is to report to the ED for evaluation Pt has appointment Wed with Dr. Eden Emms. Pt verbalizes understanding and is in agreement.

## 2013-10-13 ENCOUNTER — Ambulatory Visit (INDEPENDENT_AMBULATORY_CARE_PROVIDER_SITE_OTHER): Payer: Medicare Other | Admitting: Cardiovascular Disease

## 2013-10-13 ENCOUNTER — Encounter: Payer: Self-pay | Admitting: Cardiovascular Disease

## 2013-10-13 VITALS — BP 163/84 | HR 89 | Ht 63.0 in | Wt 157.0 lb

## 2013-10-13 DIAGNOSIS — I10 Essential (primary) hypertension: Secondary | ICD-10-CM

## 2013-10-13 DIAGNOSIS — I2510 Atherosclerotic heart disease of native coronary artery without angina pectoris: Secondary | ICD-10-CM

## 2013-10-13 DIAGNOSIS — I498 Other specified cardiac arrhythmias: Secondary | ICD-10-CM

## 2013-10-13 DIAGNOSIS — L989 Disorder of the skin and subcutaneous tissue, unspecified: Secondary | ICD-10-CM | POA: Insufficient documentation

## 2013-10-13 DIAGNOSIS — E785 Hyperlipidemia, unspecified: Secondary | ICD-10-CM

## 2013-10-13 MED ORDER — LOSARTAN POTASSIUM 50 MG PO TABS: 50.0000 mg | ORAL_TABLET | Freq: Every day | ORAL | Status: DC

## 2013-10-13 MED ORDER — NITROGLYCERIN 0.4 MG SL SUBL: 0.4000 mg | SUBLINGUAL_TABLET | SUBLINGUAL | Status: DC | PRN

## 2013-10-13 NOTE — Progress Notes (Signed)
Patient ID: Kathy George, female   DOB: 10-18-1941, 72 y.o.   MRN: [id number] Kathy George returns in followup. She had previous stent to the LAD. She hada re-look and the stent was widely patent despite atypical chest pain.The patient continues to feel fatigued. I explained to her that herheart function was normal at 55%. She should not be having any fatigueor shortness of breath from her heart. She is not having any more chestpain. She needs to start exercising again. She is on a lower dose of HCTZ and crestor now. She has some plantar fascitis that is bothering her but otherwise nothing new Sees Kathy George for scleroderma. No chest pain Atypical arm pain with office visit earlier in week no ECG changes.  Muscular pain in shoulder and elbow.  BP been running high Dr Asencion Partridge new primary    ROS: Denies fever, malais, weight loss, blurry vision, decreased visual acuity, cough, sputum, SOB, hemoptysis, pleuritic pain, palpitaitons, heartburn, abdominal pain, melena, lower extremity edema, claudication, or rash.  All other systems reviewed and negative  General: Affect appropriate Healthy:  appears stated age HEENT: normal Neck supple with no adenopathy JVP normal no bruits no thyromegaly Lungs clear with no wheezing and good diaphragmatic motion Heart:  S1/S2 no murmur, no rub, gallop or click PMI normal Abdomen: benighn, BS positve, no tenderness, no AAA no bruit.  No HSM or HJR Distal pulses intact with no bruits No edema Neuro non-focal Skin warm and dry ? Small lipoma in left anti cubital fossa No muscular weakness   Current Outpatient Prescriptions  Medication Sig Dispense Refill  . aspirin 325 MG tablet Take 325 mg by mouth daily.        . carvedilol (COREG) 6.25 MG tablet TAKE 1 TABLET TWICE A DAY--PT IS WAITING ON A NEW DOSE TO COME IN MAIL      . clonazePAM (KLONOPIN) 1 MG tablet As needed      . esomeprazole (NEXIUM) 40 MG capsule Take 40 mg by mouth daily at 12 noon.      Marland Kitchen  estradiol (VIVELLE-DOT) 0.025 MG/24HR Place 1 patch onto the skin once a week.      Marland Kitchen ibuprofen (ADVIL,MOTRIN) 200 MG tablet Take 200 mg by mouth every 6 (six) hours as needed.        . montelukast (SINGULAIR) 10 MG tablet Take 10 mg by mouth daily.      . nitroGLYCERIN (NITROSTAT) 0.4 MG SL tablet Place 0.4 mg under the tongue every 5 (five) minutes as needed.        . [DISCONTINUED] cetirizine (ZYRTEC) 10 MG tablet Take 10 mg by mouth daily.        . [DISCONTINUED] rosuvastatin (CRESTOR) 5 MG tablet Take 5 mg by mouth daily.         No current facility-administered medications for this visit.    Allergies  Review of patient's allergies indicates no known allergies.  Electrocardiogram:  10/11/13 SR rate 58  LAD   Assessment and Plan

## 2013-10-13 NOTE — Assessment & Plan Note (Signed)
She will get BP cuff Add cozaar 50 mg  BMET in 3 weeks f/u PA in 6

## 2013-10-13 NOTE — Patient Instructions (Signed)
Your physician recommends that you schedule a follow-up appointment in:   6  WEEKS WITH   PA  AND  3 MONTHS WITH  DR  [facility]  Your physician has recommended you make the following change in your medication:  START LOSARTAN  50 MG  1 TAB  EVERY DAY  Your physician recommends that you return for lab work in:  3 WEEKS   BMET

## 2013-10-13 NOTE — Assessment & Plan Note (Signed)
Cholesterol is at goal.  Continue current dose of statin and diet Rx.  No myalgias or side effects.  F/U  LFT's in 6 months. Lab Results  Component Value Date   LDLCALC  Value: 160        Total Cholesterol/HDL:CHD Risk Coronary Heart Disease Risk Table                     Men   Women  1/2 Average Risk   3.4   3.3  Average Risk       5.0   4.4  2 X Average Risk   9.6   7.1  3 X Average Risk  23.4   11.0        Use the calculated Patient Ratio above and the CHD Risk Table to determine the patient's CHD Risk.        ATP III CLASSIFICATION (LDL):  <100     mg/dL   Optimal  100-129  mg/dL   Near or Above                    Optimal  130-159  mg/dL   Borderline  160-189  mg/dL   High  >190     mg/dL   Very High* 05/27/2009             

## 2013-10-13 NOTE — Assessment & Plan Note (Signed)
She was concerned about a "bumb" in her left anticubital fossa Appears to have small lipoma

## 2013-10-13 NOTE — Assessment & Plan Note (Signed)
Stable with no angina and good activity level.  Continue medical Rx  

## 2013-10-13 NOTE — Assessment & Plan Note (Signed)
Reslolved continue beta blocker NO palpitations

## 2013-11-02 ENCOUNTER — Ambulatory Visit (INDEPENDENT_AMBULATORY_CARE_PROVIDER_SITE_OTHER): Payer: Medicare Other | Admitting: *Deleted

## 2013-11-02 DIAGNOSIS — E785 Hyperlipidemia, unspecified: Secondary | ICD-10-CM

## 2013-11-02 DIAGNOSIS — I10 Essential (primary) hypertension: Secondary | ICD-10-CM

## 2013-11-02 LAB — BASIC METABOLIC PANEL
GFR: 64.15 mL/min (ref >60.00)
Glucose, Bld: 91 mg/dL (ref 70–99)
Potassium: 4.3 mEq/L (ref 3.5–5.1)
Sodium: 132 mEq/L — ABNORMAL LOW (ref 135–145)

## 2013-11-24 ENCOUNTER — Ambulatory Visit: Payer: Medicare Other | Admitting: Nurse Practitioner

## 2014-01-14 ENCOUNTER — Encounter: Payer: Self-pay | Admitting: Cardiovascular Disease

## 2014-01-14 ENCOUNTER — Ambulatory Visit (INDEPENDENT_AMBULATORY_CARE_PROVIDER_SITE_OTHER): Payer: Medicare Other | Admitting: Cardiovascular Disease

## 2014-01-14 ENCOUNTER — Encounter (INDEPENDENT_AMBULATORY_CARE_PROVIDER_SITE_OTHER): Payer: Self-pay

## 2014-01-14 VITALS — BP 190/108 | HR 52 | Ht 63.0 in | Wt 159.0 lb

## 2014-01-14 DIAGNOSIS — I498 Other specified cardiac arrhythmias: Secondary | ICD-10-CM

## 2014-01-14 DIAGNOSIS — I10 Essential (primary) hypertension: Secondary | ICD-10-CM

## 2014-01-14 DIAGNOSIS — I4891 Unspecified atrial fibrillation: Secondary | ICD-10-CM

## 2014-01-14 DIAGNOSIS — I2510 Atherosclerotic heart disease of native coronary artery without angina pectoris: Secondary | ICD-10-CM

## 2014-01-14 DIAGNOSIS — E785 Hyperlipidemia, unspecified: Secondary | ICD-10-CM

## 2014-01-14 MED ORDER — ESOMEPRAZOLE MAGNESIUM 40 MG PO CPDR: 40.0000 mg | DELAYED_RELEASE_CAPSULE | Freq: Every day | ORAL | Status: DC

## 2014-01-14 MED ORDER — CARVEDILOL 6.25 MG PO TABS: 6.2500 mg | ORAL_TABLET | Freq: Two times a day (BID) | ORAL | Status: DC

## 2014-01-14 MED ORDER — OLMESARTAN MEDOXOMIL-HCTZ 40-25 MG PO TABS: 1.0000 | ORAL_TABLET | Freq: Every day | ORAL | Status: DC

## 2014-01-14 MED ORDER — LORATADINE 10 MG PO TABS: 10.0000 mg | ORAL_TABLET | Freq: Every day | ORAL | Status: DC

## 2014-01-14 NOTE — Assessment & Plan Note (Signed)
No recurrence or palpitations continue beta blocker

## 2014-01-14 NOTE — Assessment & Plan Note (Signed)
Cholesterol is at goal.  Continue current dose of statin and diet Rx.  No myalgias or side effects.  F/U  LFT's in 6 months. Lab Results  Component Value Date   LDLCALC  Value: 160        Total Cholesterol/HDL:CHD Risk Coronary Heart Disease Risk Table                     Men   Women  1/2 Average Risk   3.4   3.3  Average Risk       5.0   4.4  2 X Average Risk   9.6   7.1  3 X Average Risk  23.4   11.0        Use the calculated Patient Ratio above and the CHD Risk Table to determine the patient's CHD Risk.        ATP III CLASSIFICATION (LDL):  <100     mg/dL   Optimal  [id number]  mg/dL   Near or Above                    Optimal  130-159  mg/dL   Borderline  [id number]  mg/dL   High  >284>190     mg/dL   Very High* 1/32/44017/24/2010

## 2014-01-14 NOTE — Progress Notes (Signed)
Patient ID: Kathy George, female   DOB: [birth year], 73 y.o.   MRN: [id number] Kathy George returns in followup. She had previous stent to the LAD. She hada re-look and the stent was widely patent despite atypical chest pain.The patient continues to feel fatigued. I explained to her that herheart function was normal at 55%. She should not be having any fatigueor shortness of breath from her heart. She is not having any more chestpain. She needs to start exercising again. She is on a lower dose of HCTZ and crestor now. She has some plantar fascitis that is bothering her but otherwise nothing new Sees Kathy George for scleroderma. No chest pain Atypical arm pain with office visit earlier in week no ECG changes. Muscular pain in shoulder and elbow. BP been running high Dr Kathy George new primary   Stopped cozaar due to bad smelling urine  BP very high today Occasional low grade headache and dizzy feeling         ROS: Denies fever, malais, weight loss, blurry vision, decreased visual acuity, cough, sputum, SOB, hemoptysis, pleuritic pain, palpitaitons, heartburn, abdominal pain, melena, lower extremity edema, claudication, or rash.  All other systems reviewed and negative  General: Affect appropriate Healthy:  appears stated age HEENT: normal Neck supple with no adenopathy JVP normal no bruits no thyromegaly Lungs clear with no wheezing and good diaphragmatic motion Heart:  S1/S2 no murmur, no rub, gallop or click PMI normal Abdomen: benighn, BS positve, no tenderness, no AAA no bruit.  No HSM or HJR Distal pulses intact with no bruits No edema Neuro non-focal Skin warm and dry No muscular weakness   Current Outpatient Prescriptions  Medication Sig Dispense Refill  . aspirin 325 MG tablet Take 325 mg by mouth daily.        . carvedilol (COREG) 6.25 MG tablet TAKE 1 TABLET TWICE A DAY--PT IS WAITING ON A NEW DOSE TO COME IN MAIL      . clonazePAM (KLONOPIN) 1 MG tablet As needed      . esomeprazole  (NEXIUM) 40 MG capsule Take 40 mg by mouth daily at 12 noon.      Marland Kitchen. estradiol (VIVELLE-DOT) 0.025 MG/24HR Place 1 patch onto the skin once a week.      Marland Kitchen. ibuprofen (ADVIL,MOTRIN) 200 MG tablet Take 200 mg by mouth every 6 (six) hours as needed.        . montelukast (SINGULAIR) 10 MG tablet Take 10 mg by mouth daily.      Marland Kitchen. NASONEX 50 MCG/ACT nasal spray       . nitroGLYCERIN (NITROSTAT) 0.4 MG SL tablet Place 1 tablet (0.4 mg total) under the tongue every 5 (five) minutes as needed.  25 tablet  4  . losartan (COZAAR) 50 MG tablet Take 1 tablet (50 mg total) by mouth daily.  90 tablet  3  . [DISCONTINUED] cetirizine (ZYRTEC) 10 MG tablet Take 10 mg by mouth daily.        . [DISCONTINUED] rosuvastatin (CRESTOR) 5 MG tablet Take 5 mg by mouth daily.         No current facility-administered medications for this visit.    Allergies  Review of patient's allergies indicates no known allergies.  Electrocardiogram:  SR LAD rate 58    Assessment and Plan

## 2014-01-14 NOTE — Assessment & Plan Note (Signed)
Compliance/Side effect issues  Start Benicar/HCTZ in addition to beta blocker F/U next available

## 2014-01-14 NOTE — Patient Instructions (Addendum)
Your physician recommends that you schedule a follow-up appointment in: NEXT [facility]ABLE [facility] Your physician has recommended you make the following change in your medication:  START  BENICAR   HCT  40 /12.5 MG  Your physician recommends that you return for lab work in:   BMET SAME DAY  AS  NEXT   APPT WITH  DR Eden EmmsNISHAN

## 2014-01-14 NOTE — Assessment & Plan Note (Signed)
Main NSR with no palpitations

## 2014-01-14 NOTE — Assessment & Plan Note (Signed)
Stable with no angina and good activity level.  Continue medical Rx  

## 2014-02-01 ENCOUNTER — Ambulatory Visit (INDEPENDENT_AMBULATORY_CARE_PROVIDER_SITE_OTHER): Payer: Medicare Other | Admitting: Cardiovascular Disease

## 2014-02-01 ENCOUNTER — Encounter: Payer: Self-pay | Admitting: Cardiovascular Disease

## 2014-02-01 ENCOUNTER — Other Ambulatory Visit: Payer: Medicare Other

## 2014-02-01 VITALS — BP 135/80 | HR 60 | Ht 63.0 in | Wt 160.0 lb

## 2014-02-01 DIAGNOSIS — I2510 Atherosclerotic heart disease of native coronary artery without angina pectoris: Secondary | ICD-10-CM

## 2014-02-01 DIAGNOSIS — I4891 Unspecified atrial fibrillation: Secondary | ICD-10-CM

## 2014-02-01 DIAGNOSIS — I10 Essential (primary) hypertension: Secondary | ICD-10-CM

## 2014-02-01 NOTE — Assessment & Plan Note (Signed)
Stable with no angina and good activity level.  Continue medical Rx  

## 2014-02-01 NOTE — Progress Notes (Signed)
Patient ID: Kathy George, female   DOB: 1941-05-13, 73 y.o.   MRN: [id number] Kathy George returns in followup. She had previous stent to the LAD. She hada re-look and the stent was widely patent despite atypical chest pain.The patient continues to feel fatigued. I explained to her that herheart function was normal at 55%. She should not be having any fatigueor shortness of breath from her heart. She is not having any more chestpain. She needs to start exercising again. She is on a lower dose of HCTZ and crestor now. She has some plantar fascitis that is bothering her but otherwise nothing new Sees [facility] for scleroderma. No chest pain Atypical arm pain with office visit earlier in week no ECG changes. Muscular pain in shoulder and elbow. BP been running high Dr Asencion Partridgeamille Andy new primary  Stopped cozaar due to bad smelling urine BP very high today Occasional low grade headache and dizzy feeling   Last visit substituted benicar HCTZ and much better    ROS: Denies fever, malais, weight loss, blurry vision, decreased visual acuity, cough, sputum, SOB, hemoptysis, pleuritic pain, palpitaitons, heartburn, abdominal pain, melena, lower extremity edema, claudication, or rash.  All other systems reviewed and negative  General: Affect appropriate Healthy:  appears stated age HEENT: normal Neck supple with no adenopathy JVP normal no bruits no thyromegaly Lungs clear with no wheezing and good diaphragmatic motion Heart:  S1/S2 no murmur, no rub, gallop or click PMI normal Abdomen: benighn, BS positve, no tenderness, no AAA no bruit.  No HSM or HJR Distal pulses intact with no bruits No edema Neuro non-focal Skin warm and dry No muscular weakness   Current Outpatient Prescriptions  Medication Sig Dispense Refill  . aspirin 325 MG tablet Take 325 mg by mouth daily.        . carvedilol (COREG) 6.25 MG tablet Take 1 tablet (6.25 mg total) by mouth 2 (two) times daily with a meal.  180 tablet  3  .  clonazePAM (KLONOPIN) 1 MG tablet Take 1 mg by mouth 2 (two) times daily as needed. As needed      . esomeprazole (NEXIUM) 40 MG capsule Take 1 capsule (40 mg total) by mouth daily at 12 noon.  90 capsule  3  . estradiol (VIVELLE-DOT) 0.025 MG/24HR Place 1 patch onto the skin once a week.      Marland Kitchen. ibuprofen (ADVIL,MOTRIN) 200 MG tablet Take 200 mg by mouth every 6 (six) hours as needed.        . loratadine (CLARITIN) 10 MG tablet Take 1 tablet (10 mg total) by mouth daily.  90 tablet  3  . montelukast (SINGULAIR) 10 MG tablet Take 10 mg by mouth daily.      Marland Kitchen. NASONEX 50 MCG/ACT nasal spray       . nitroGLYCERIN (NITROSTAT) 0.4 MG SL tablet Place 1 tablet (0.4 mg total) under the tongue every 5 (five) minutes as needed.  25 tablet  4  . olmesartan-hydrochlorothiazide (BENICAR HCT) 40-25 MG per tablet Take 1 tablet by mouth daily.  30 tablet  1  . [DISCONTINUED] cetirizine (ZYRTEC) 10 MG tablet Take 10 mg by mouth daily.        . [DISCONTINUED] rosuvastatin (CRESTOR) 5 MG tablet Take 5 mg by mouth daily.         No current facility-administered medications for this visit.    Allergies  Review of patient's allergies indicates no known allergies.  Electrocardiogram:    Assessment and Plan

## 2014-02-01 NOTE — Patient Instructions (Signed)
Your physician recommends that you schedule a follow-up appointment in: 6-8 WEEKS [facility]  Your physician recommends that you continue on your current medications as directed. Please refer to the Current Medication list given to you today.

## 2014-02-01 NOTE — Assessment & Plan Note (Signed)
Maint NSR continue ASA no palpitations

## 2014-02-01 NOTE — Assessment & Plan Note (Signed)
Improved on benicar/HCTZ  Low sodium diet improved

## 2014-03-09 ENCOUNTER — Telehealth: Payer: Self-pay | Admitting: Cardiovascular Disease

## 2014-03-09 NOTE — Telephone Encounter (Signed)
Patient reports having UTI and thinks is from the Benicar. I instructed her to monitor BP regularly at home. She has checked her BP at a pharmacy 2 days in a row. 160/59, 101/60.  Both before Benicar. She also reports losing her balance recently, and "bumping into walls" And states her "vision has deteriorated", but when discussed further it is just that she cant read fine print. She has appointment with opthamalogy on 5/11.   I advised her to call PCP with these symptoms and monitor BP She verbalizes understanding and agreement.

## 2014-03-09 NOTE — Telephone Encounter (Signed)
New Prob   Pt has some questions regarding one of her medications. Please call.

## 2014-03-30 ENCOUNTER — Ambulatory Visit: Payer: Medicare Other | Admitting: Cardiovascular Disease

## 2014-04-15 ENCOUNTER — Other Ambulatory Visit: Payer: Self-pay | Admitting: Dermatology

## 2014-05-26 ENCOUNTER — Encounter: Payer: Self-pay | Admitting: Cardiovascular Disease

## 2014-05-26 ENCOUNTER — Encounter: Payer: Self-pay | Admitting: *Deleted

## 2014-05-27 ENCOUNTER — Ambulatory Visit (INDEPENDENT_AMBULATORY_CARE_PROVIDER_SITE_OTHER): Payer: Medicare Other | Admitting: Cardiovascular Disease

## 2014-05-27 ENCOUNTER — Encounter: Payer: Self-pay | Admitting: Cardiovascular Disease

## 2014-05-27 VITALS — BP 134/78 | HR 56 | Ht 64.0 in | Wt 163.4 lb

## 2014-05-27 DIAGNOSIS — E785 Hyperlipidemia, unspecified: Secondary | ICD-10-CM

## 2014-05-27 DIAGNOSIS — G459 Transient cerebral ischemic attack, unspecified: Secondary | ICD-10-CM | POA: Insufficient documentation

## 2014-05-27 DIAGNOSIS — G458 Other transient cerebral ischemic attacks and related syndromes: Secondary | ICD-10-CM

## 2014-05-27 DIAGNOSIS — G451 Carotid artery syndrome (hemispheric): Secondary | ICD-10-CM

## 2014-05-27 NOTE — Assessment & Plan Note (Signed)
Improved continue norvasc and coreg  Intolerant to ACE/ARB;s

## 2014-05-27 NOTE — Assessment & Plan Note (Signed)
Stable with no angina and good activity level.  Continue medical Rx  

## 2014-05-27 NOTE — Patient Instructions (Addendum)
Your physician wants you to follow-up in:   6 MONTHS WITH  DR Haywood FillerNISHAN  You will receive a reminder letter in the mail two months in advance. If you don't receive a letter, please call our office to schedule the follow-up appointment. Your physician recommends that you continue on your current medications as directed. Please refer to the Current Medication list given to you today. You have been referred to DR  TAT ?   TIA

## 2014-05-27 NOTE — Assessment & Plan Note (Signed)
Etiology not clear continue ASA  [facility] She wants another opinion and will refer to our neurologist  Check carotid duplex

## 2014-05-27 NOTE — Assessment & Plan Note (Signed)
Cholesterol is at goal.  Continue current dose of statin and diet Rx.  No myalgias or side effects.  F/U  LFT's in 6 months. Lab Results  Component Value Date   LDLCALC  Value: 160        Total Cholesterol/HDL:CHD Risk Coronary Heart Disease Risk Table                     Men   Women  1/2 Average Risk   3.4   3.3  Average Risk       5.0   4.4  2 X Average Risk   9.6   7.1  3 X Average Risk  23.4   11.0        Use the calculated Patient Ratio above and the CHD Risk Table to determine the patient's CHD Risk.        ATP III CLASSIFICATION (LDL):  <100     mg/dL   Optimal  [id number]  mg/dL   Near or Above                    Optimal  130-159  mg/dL   Borderline  [id number]  mg/dL   High  >811>190     mg/dL   Very High* 9/14/78297/24/2010

## 2014-05-27 NOTE — Progress Notes (Signed)
Patient ID: Kathy DolphinOletta Jasmin, female   DOB: [birth year], 73 y.o.   MRN: [id number] Cyndie MullOletta returns in followup. She had previous stent to the LAD. She hada re-look and the stent was widely patent despite atypical chest pain.The patient continues to feel fatigued. I explained to her that herheart function was normal at 55%. She should not be having any fatigueor shortness of breath from her heart. She is not having any more chestpain. She needs to start exercising again. She is on a lower dose of HCTZ and crestor now. She has some plantar fascitis that is bothering her but otherwise nothing new Sees [facility] for scleroderma. No chest pain Atypical arm pain with office visit earlier in week no ECG changes. Muscular pain in shoulder and elbow. BP been running high Dr Asencion Partridgeamille Andy new primary  Stopped cozaar due to bad smelling urine BP very high today Occasional low grade headache and dizzy feeling   Now on norvasc with improvement  [facility]  Not clear she had LLE pain and weakness.  Said she had CT/MRI but not put on additional antiplatlet and no carotid duplex done    ROS: Denies fever, malais, weight loss, blurry vision, decreased visual acuity, cough, sputum, SOB, hemoptysis, pleuritic pain, palpitaitons, heartburn, abdominal pain, melena, lower extremity edema, claudication, or rash.  All other systems reviewed and negative  General: Affect appropriate Healthy:  appears stated age HEENT: normal Neck supple with no adenopathy JVP normal no bruits no thyromegaly Lungs clear with no wheezing and good diaphragmatic motion Heart:  S1/S2 no murmur, no rub, gallop or click PMI normal Abdomen: benighn, BS positve, no tenderness, no AAA no bruit.  No HSM or HJR Distal pulses intact with no bruits No edema Neuro non-focal Skin warm and dry No muscular weakness   Current Outpatient Prescriptions  Medication Sig Dispense Refill  . amLODipine (NORVASC) 5 MG  tablet Take 5 mg by mouth.      Marland Kitchen. aspirin 325 MG tablet Take 325 mg by mouth daily.        . carvedilol (COREG) 12.5 MG tablet Take 12.5 mg by mouth.      . cetirizine (ZYRTEC) 10 MG tablet Take 10 mg by mouth.      . Cholecalciferol (VITAMIN D3) 2000 UNITS capsule 1 tablet once a day      . esomeprazole (NEXIUM) 40 MG capsule Take 1 capsule (40 mg total) by mouth daily at 12 noon.  90 capsule  3  . estradiol (VIVELLE-DOT) 0.025 MG/24HR Place 1 patch onto the skin once a week.      Marland Kitchen. ibuprofen (ADVIL,MOTRIN) 200 MG tablet Take 200 mg by mouth every 6 (six) hours as needed.        . nitroGLYCERIN (NITROSTAT) 0.4 MG SL tablet Place 1 tablet (0.4 mg total) under the tongue every 5 (five) minutes as needed.  25 tablet  4  . [DISCONTINUED] rosuvastatin (CRESTOR) 5 MG tablet Take 5 mg by mouth daily.         No current facility-administered medications for this visit.    Allergies  Review of patient's allergies indicates no known allergies.  Electrocardiogram: 10/11/13 SB rate 58 LAD nonspecific T wave changes   Assessment and Plan

## 2014-05-28 ENCOUNTER — Encounter: Payer: Self-pay | Admitting: Cardiovascular Disease

## 2014-07-04 ENCOUNTER — Encounter: Payer: Self-pay | Admitting: *Deleted

## 2014-07-05 ENCOUNTER — Ambulatory Visit (INDEPENDENT_AMBULATORY_CARE_PROVIDER_SITE_OTHER): Payer: Medicare Other | Admitting: Neurology

## 2014-07-05 ENCOUNTER — Encounter: Payer: Self-pay | Admitting: Neurology

## 2014-07-05 ENCOUNTER — Encounter: Payer: Self-pay | Admitting: *Deleted

## 2014-07-05 VITALS — BP 138/64 | HR 74 | Temp 98.7°F | Resp 16 | Ht 64.0 in | Wt 163.7 lb

## 2014-07-05 DIAGNOSIS — G450 Vertebro-basilar artery syndrome: Secondary | ICD-10-CM

## 2014-07-05 LAB — HEMOGLOBIN A1C
Hgb A1c MFr Bld: 5.5 % (ref <5.7)
MEAN PLASMA GLUCOSE: 111 mg/dL (ref <117)

## 2014-07-05 NOTE — Patient Instructions (Signed)
1.  Consider switching from aspirin to Plavix for secondary stroke prevention. 2.  Restart Crestor 3.  For complete stroke workup, will get 2D echocardiogram and check Hgb A1c. 4.  Routine exercise and Mediterranean diet 5.  Blood pressure control 6.  Follow up in 3 months.

## 2014-07-05 NOTE — Progress Notes (Signed)
NEUROLOGY CONSULTATION NOTE  [name] MRN: [id number] DOB: 02-25-1941  Referring provider: Dr. Eden Emms Primary care provider: Dr. Mardelle Matte  Reason for consult:  Stroke  HISTORY OF PRESENT ILLNESS: Kathy George is a 73 year old right-handed woman with history of CAD with LAD stent, dyslipidemia, hypertension, scleroderma and Raynaud's who presents for second opinion regarding TIA.  Records reviewed.  For a couple of months, she had been having very high blood pressures, 190s/100s.  She felt sick and was prone to crying.  She exhibited ataxia with stumbling to the left, blurred vision, slowed speech and episodes of lightheadedness, like she was going to pass out.  This had been going on for about a month.  Reportedly, her Na level was 121 and she began to eat salty foods.  She saw her PCP on 03/22/14 with these symptoms.  She also was found to have dysmetria.  She was advised to go to [facility].  CT of the head showed no acute abnormalities.  MRI of the brain without contrast revealed mild small vessel ischemic changes but no acute infarct.  MRA of the head revealed no significant intracranial stenosis or aneurysms.  MRA of the neck was unremarkable as well.  Blood work included PT 10.9, PTT 37, INR 0.9, negative cardiac enzymes, BNP 203, CK 88, Mg 2.10, and phosphorus 4.2.  Na level then was 136.  UA was negative.  ECG showed mild sinus bradycardia of 55 bpm.  CXR was negative.  Recent LDL from 03/10/14 was 135.  She remained on ASA .  She was previously on Crestor, but stopped while she was feeling sick because she thought it may have contributed to her symptoms.  She was evaluated by a neurologist, Dr. Vallery Ridge, who suspected TIA.  TSH was checked, which was 5.030.  She also has bilateral hip bursitis and was complaining of left leg pain.  Her left leg was weak and she went to PT for one month and regained her strength.  Her mood has improved too.  Current medications  include ASA , amlodipine, Coreg.  PAST MEDICAL HISTORY: Past Medical History  Diagnosis Date  . Paroxysmal atrial fibrillation   . Anemia   . Raynaud's disease   . Depression   . Anxiety   . Dyslipidemia   . Supraventricular tachycardia   . Hypertension   . Hiatal hernia   . GERD (gastroesophageal reflux disease)   . H/O: hysterectomy   . CAD (coronary artery disease)     successful IVUs guided PCI of the lesion in the  proximal LAD using a promus drug-eluting stent with improvement in Fentanyl narrowing from 80% to 0%  . SUPRAVENTRICULAR TACHYCARDIA   . Scleroderma     PAST SURGICAL HISTORY: Past Surgical History  Procedure Laterality Date  . Breast enhancement surgery    . Right rotator cuff repair    . Total abdominal hysterectomy      MEDICATIONS: Current Outpatient Prescriptions on File Prior to Visit  Medication Sig Dispense Refill  . amLODipine (NORVASC) 5 MG tablet Take 5 mg by mouth.      Marland Kitchen aspirin 325 MG tablet Take 325 mg by mouth daily.        . carvedilol (COREG) 12.5 MG tablet Take 12.5 mg by mouth.      . cetirizine (ZYRTEC) 10 MG tablet Take 10 mg by mouth.      . Cholecalciferol (VITAMIN D3) 2000 UNITS capsule 1 tablet once a day      .  esomeprazole (NEXIUM) 40 MG capsule Take 1 capsule (40 mg total) by mouth daily at 12 noon.  90 capsule  3  . estradiol (VIVELLE-DOT) 0.025 MG/24HR Place 1 patch onto the skin once a week.      Marland Kitchen ibuprofen (ADVIL,MOTRIN) 200 MG tablet Take 200 mg by mouth every 6 (six) hours as needed.        . nitroGLYCERIN (NITROSTAT) 0.4 MG SL tablet Place 1 tablet (0.4 mg total) under the tongue every 5 (five) minutes as needed.  25 tablet  4  . [DISCONTINUED] rosuvastatin (CRESTOR) 5 MG tablet Take 5 mg by mouth daily.         No current facility-administered medications on file prior to visit.    ALLERGIES: No Known Allergies  FAMILY HISTORY: Family History  Problem Relation Age of Onset  . Kidney disease Mother      chronic; intracranial hemorrhage  . Coronary artery disease Other     grandfather  . Hyperlipidemia Daughter   . Rheum arthritis Maternal Aunt   . Ovarian cancer Maternal Grandmother     SOCIAL HISTORY: History   Social History  . Marital Status: Divorced    Spouse Name: N/A    Number of Children: Y  . Years of Education: some colle   Occupational History  . 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 Smoking status: Former Smoker -- 0.10 packs/day for 1 years    Types: Cigarettes    Quit date: 11/04/1960  . Smokeless tobacco: Not on file     Comment: only smoked socially in early 20s  . Alcohol Use: Yes     Comment: socially  . Drug Use: No  . Sexual Activity: No   Other Topics Concern  . Not on file   Social History Narrative   Lives with her boyfriend, Marijean Niemann    REVIEW OF SYSTEMS: Constitutional: No fevers, chills, or sweats, no generalized fatigue, change in appetite Eyes: No visual changes, double vision, eye pain Ear, nose and throat: No hearing loss, ear pain, nasal congestion, sore throat Cardiovascular: No chest pain, palpitations Respiratory:  No shortness of breath at rest or with exertion, wheezes GastrointestinaI: No nausea, vomiting, diarrhea, abdominal pain, fecal incontinence Genitourinary:  No dysuria, urinary retention or frequency Musculoskeletal:  No neck pain, back pain Integumentary: No rash, pruritus, skin lesions Neurological: as above Psychiatric: No depression, insomnia, anxiety Endocrine: No palpitations, fatigue, diaphoresis, mood swings, change in appetite, change in weight, increased thirst Hematologic/Lymphatic:  No anemia, purpura, petechiae. Allergic/Immunologic: no itchy/runny eyes, nasal congestion, recent allergic reactions, rashes  PHYSICAL EXAM: Filed Vitals:   07/05/14 0952  BP: 138/64  Pulse: 74  Temp: 98.7 F (37.1 C)  Resp: 16   General: No acute distress Head:   Normocephalic/atraumatic Neck: supple, no paraspinal tenderness, full range of motion Back: No paraspinal tenderness Heart: regular rate and rhythm Lungs: Clear to auscultation bilaterally. Vascular: No carotid bruits. Neurological Exam: Mental status: alert and oriented to person, place, and time, recent and remote memory intact, fund of knowledge intact, attention and concentration intact, speech fluent and not dysarthric, language intact. Cranial nerves: CN I: not tested CN II: pupils equal, round and reactive to light, visual fields intact, fundi unremarkable, without vessel changes, exudates, hemorrhages or papilledema. CN III, IV, VI:  full range of motion, no nystagmus, no ptosis CN V: facial sensation intact CN VII: upper and lower face  symmetric CN VIII: hearing intact CN IX, X: gag intact, uvula midline CN XI: sternocleidomastoid and trapezius muscles intact CN XII: tongue midline Bulk & Tone: normal, no fasciculations. Motor:  Sensation: pinprick and vibration intact Deep Tendon Reflexes: 2+ throughout except reduced in the right patellar.  Toes downgoing. Finger to nose testing: no dysmetria Heel to shin: no dysmetria Gait: normal station and stride.  Able to turn and walk in tandem. Romberg negative.  IMPRESSION: 1.  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  If symptoms lasted for at least a couple of weeks, it would unlikely be a TIA and more likely a stroke that was no longer seen on MRI..  Imaging reveals no vertebrobasilar insufficiency, which is good.    PLAN: Due to aspirin failure, I would switch antiplatelet therapy to Plavix, although this is a lateral change.  She would like to hold off on this change for now. Would check 2D echo for complete TIA workup Continue statin therapy (LDL goal should be less than 100) Check Hgb A1c (should be less than 7) Blood pressure control Mediterranean diet. She is following up with her PCP in November, where  some blood work will be repeated.  With the very high blood pressure, anxiety and hyponatremia, may consider pheochromocytoma.  However, she has been stable and doing well. Follow up in 3 months.  Thank you for allowing me to take part in the care of this patient.  Shon Millet, DO  CC:  Asencion Partridge, MD  Charlton Haws, MD

## 2014-07-06 ENCOUNTER — Telehealth: Payer: Self-pay | Admitting: *Deleted

## 2014-07-06 NOTE — Telephone Encounter (Signed)
Patient is aware of lab results.

## 2014-07-06 NOTE — Telephone Encounter (Signed)
Message copied by Fredirick Maudlin on Wed Jul 06, 2014  8:30 AM ------      Message from: JAFFE, ADAM R      Created: Wed Jul 06, 2014  6:03 AM       Hgb A1c is good.      ----- Message -----         From: Lab in [facility] Interface         Sent: 07/05/2014   7:45 PM           To: Cira Servant, DO                   ------

## 2014-07-08 ENCOUNTER — Other Ambulatory Visit (HOSPITAL_COMMUNITY): Payer: Medicare Other

## 2014-07-15 ENCOUNTER — Telehealth: Payer: Self-pay | Admitting: Cardiovascular Disease

## 2014-07-15 ENCOUNTER — Telehealth: Payer: Self-pay | Admitting: *Deleted

## 2014-07-15 ENCOUNTER — Ambulatory Visit (HOSPITAL_COMMUNITY)
Admission: RE | Admit: 2014-07-15 | Discharge: 2014-07-15 | Disposition: A | Discharge status: Home / Self Care | Payer: Medicare Other | Source: Ambulatory Visit | Attending: Neurology | Admitting: Neurology

## 2014-07-15 DIAGNOSIS — D649 Anemia, unspecified: Secondary | ICD-10-CM | POA: Insufficient documentation

## 2014-07-15 DIAGNOSIS — I4891 Unspecified atrial fibrillation: Secondary | ICD-10-CM | POA: Insufficient documentation

## 2014-07-15 DIAGNOSIS — Z8673 Personal history of transient ischemic attack (TIA), and cerebral infarction without residual deficits: Secondary | ICD-10-CM | POA: Diagnosis not present

## 2014-07-15 DIAGNOSIS — Z87891 Personal history of nicotine dependence: Secondary | ICD-10-CM | POA: Diagnosis not present

## 2014-07-15 DIAGNOSIS — I498 Other specified cardiac arrhythmias: Secondary | ICD-10-CM | POA: Diagnosis not present

## 2014-07-15 DIAGNOSIS — G459 Transient cerebral ischemic attack, unspecified: Secondary | ICD-10-CM | POA: Insufficient documentation

## 2014-07-15 DIAGNOSIS — G450 Vertebro-basilar artery syndrome: Secondary | ICD-10-CM

## 2014-07-15 DIAGNOSIS — K219 Gastro-esophageal reflux disease without esophagitis: Secondary | ICD-10-CM | POA: Insufficient documentation

## 2014-07-15 DIAGNOSIS — I517 Cardiomegaly: Secondary | ICD-10-CM

## 2014-07-15 DIAGNOSIS — E785 Hyperlipidemia, unspecified: Secondary | ICD-10-CM | POA: Diagnosis not present

## 2014-07-15 DIAGNOSIS — I10 Essential (primary) hypertension: Secondary | ICD-10-CM | POA: Diagnosis not present

## 2014-07-15 NOTE — Telephone Encounter (Signed)
New message      Talk to a nurse regarding her bp being high

## 2014-07-15 NOTE — Telephone Encounter (Signed)
Spoke with pt who reports blood pressure was checked at hospital today when she was there for echo and it was 146/90. She has had headache off and on for 3 days. No headache at present time. No weakness,numbness or stroke like symptoms.   Recent blood pressure readings- AM 9/11- 156/52. 9/10-146/80,147/68 9/9-129/55 9/8-AM-108/68, PM-149/73 9/7-150/73  She rechecked while on the phone with me and it is now 148/73.   I have asked her to continue to monitor blood pressure and call us if it becomes elevated again. She has ibuprofen on med list but states she does not take. I told her she can take tylenol as needed for headache.

## 2014-07-15 NOTE — Progress Notes (Signed)
*  PRELIMINARY RESULTS* Echocardiogram 2D Echocardiogram has been performed.  Kathy George 07/15/2014, 11:54 AM

## 2014-07-15 NOTE — Telephone Encounter (Signed)
She should call her PCP or cardiologist.

## 2014-07-15 NOTE — Telephone Encounter (Signed)
Patient called stating her blood pressure was 146/90 is this something she needs to be concerned about ?

## 2014-07-19 ENCOUNTER — Telehealth: Payer: Self-pay | Admitting: *Deleted

## 2014-07-19 NOTE — Telephone Encounter (Signed)
Message copied by Fredirick Maudlin on Tue Jul 19, 2014  9:03 AM ------      Message from: JAFFE, ADAM R      Created: Mon Jul 18, 2014  7:09 AM       Echo looks unremarkable.      ----- Message -----         From: Lab in [facility] Interface         Sent: 07/15/2014   5:27 PM           To: Cira Servant, DO                   ------

## 2014-07-19 NOTE — Telephone Encounter (Signed)
Patient is aware of normal 2D ECHO 

## 2014-08-19 ENCOUNTER — Other Ambulatory Visit: Payer: Self-pay

## 2014-08-26 ENCOUNTER — Other Ambulatory Visit: Payer: Self-pay | Admitting: Dermatology

## 2014-09-06 ENCOUNTER — Encounter: Payer: Self-pay | Admitting: Gastroenterology

## 2014-09-26 ENCOUNTER — Telehealth: Payer: Self-pay | Admitting: *Deleted

## 2014-09-26 NOTE — Telephone Encounter (Signed)
Patient canceled follow up appointment will call back to reschedule

## 2014-10-04 ENCOUNTER — Ambulatory Visit: Payer: Medicare Other | Admitting: Neurology

## 2014-12-12 NOTE — Progress Notes (Signed)
Patient ID: Kathy George, female   DOB: 1941-07-16, 74 y.o.   MRN: [id number] Kathy George returns in followup. She had previous stent to the LAD. She hada re-look and the stent was widely patent despite atypical chest pain.The patient continues to feel fatigued. I explained to her that herheart function was normal at 55%. She should not be having any fatigueor shortness of breath from her heart. She is not having any more chestpain. She needs to start exercising again. She is on a lower dose of HCTZ and crestor now. She has some plantar fascitis that is bothering her but otherwise nothing new Sees Kathy George for scleroderma. No chest pain Atypical arm pain with office visit earlier in week no ECG changes. Muscular pain in shoulder and elbow. BP been running high Dr Asencion Partridgeamille Andy new primary  Stopped cozaar due to bad smelling urine BP     [facility]  Not clear she had LLE pain and weakness.  Said she had CT/MRI but not put on additional antiplatlet and no carotid duplex done Seen by Dr Everlena CooperJaffe neurology more recently and suggested plavix instead of asa but patient did not want to change  F/U echo done  07/15/14  Reviewed EF normal moderate LAE and MAC with mild MR     ROS: Denies fever, malais, weight loss, blurry vision, decreased visual acuity, cough, sputum, SOB, hemoptysis, pleuritic pain, palpitaitons, heartburn, abdominal pain, melena, lower extremity edema, claudication, or rash.  All other systems reviewed and negative  General: Affect appropriate Healthy:  appears stated age HEENT: normal Neck supple with no adenopathy JVP normal no bruits no thyromegaly Lungs clear with no wheezing and good diaphragmatic motion Heart:  S1/S2 no murmur, no rub, gallop or click PMI normal Abdomen: benighn, BS positve, no tenderness, no AAA no bruit.  No HSM or HJR Distal pulses intact with no bruits No edema Neuro non-focal Skin warm and dry No muscular  weakness   Current Outpatient Prescriptions  Medication Sig Dispense Refill  . amLODipine (NORVASC) 10 MG tablet Take 10 mg by mouth daily.    Marland Kitchen. aspirin 325 MG tablet Take 325 mg by mouth daily.      . carvedilol (COREG) 12.5 MG tablet Take 12.5 mg by mouth.    . carvedilol (COREG) 12.5 MG tablet Take 12.5 mg by mouth 2 (two) times daily with a meal.    . Cholecalciferol (VITAMIN D3) 2000 UNITS capsule 1 tablet once a day    . montelukast (SINGULAIR) 10 MG tablet Take 10 mg by mouth.    . naproxen sodium (ANAPROX) 220 MG tablet Take 220 mg by mouth daily.    . nitroGLYCERIN (NITROSTAT) 0.4 MG SL tablet Place 1 tablet (0.4 mg total) under the tongue every 5 (five) minutes as needed. 25 tablet 4  . rosuvastatin (CRESTOR) 10 MG tablet Take 10 mg by mouth.     No current facility-administered medications for this visit.    Allergies  Review of patient's allergies indicates no known allergies.  Electrocardiogram: 10/11/13 SB rate 58 LAD nonspecific T wave changes   12/13/14  SB rate 47  LAD    Assessment and Plan

## 2014-12-13 ENCOUNTER — Encounter: Payer: Self-pay | Admitting: Cardiovascular Disease

## 2014-12-13 ENCOUNTER — Ambulatory Visit (INDEPENDENT_AMBULATORY_CARE_PROVIDER_SITE_OTHER): Payer: Medicare HMO | Admitting: Cardiovascular Disease

## 2014-12-13 VITALS — BP 130/70 | HR 47 | Ht 64.0 in | Wt 163.8 lb

## 2014-12-13 DIAGNOSIS — I2510 Atherosclerotic heart disease of native coronary artery without angina pectoris: Secondary | ICD-10-CM | POA: Diagnosis not present

## 2014-12-13 DIAGNOSIS — I10 Essential (primary) hypertension: Secondary | ICD-10-CM | POA: Diagnosis not present

## 2014-12-13 DIAGNOSIS — R001 Bradycardia, unspecified: Secondary | ICD-10-CM

## 2014-12-13 MED ORDER — CLOPIDOGREL BISULFATE 75 MG PO TABS: 75.0000 mg | ORAL_TABLET | Freq: Every day | ORAL | Status: DC

## 2014-12-13 MED ORDER — CARVEDILOL 6.25 MG PO TABS: 6.2500 mg | ORAL_TABLET | Freq: Two times a day (BID) | ORAL | Status: DC

## 2014-12-13 NOTE — Assessment & Plan Note (Signed)
Well controlled.  Continue current medications and low sodium Dash type diet.    

## 2014-12-13 NOTE — Assessment & Plan Note (Signed)
No AV block  Decrease coreg to 6.25 bid  F/u with me next available to reassess

## 2014-12-13 NOTE — Assessment & Plan Note (Signed)
Stable with no angina and good activity level.  Continue medical Rx  

## 2014-12-13 NOTE — Assessment & Plan Note (Signed)
Cholesterol is at goal.  Continue current dose of statin and diet Rx.  No myalgias or side effects.  F/U  LFT's in 6 months. Lab Results  Component Value Date   LDLCALC * 05/27/2009    160        Total Cholesterol/HDL:CHD Risk Coronary Heart Disease Risk Table                     Men   Women  1/2 Average Risk   3.4   3.3  Average Risk       5.0   4.4  2 X Average Risk   9.6   7.1  3 X Average Risk  23.4   11.0        Use the calculated Patient Ratio above and the CHD Risk Table to determine the patient's CHD Risk.        ATP III CLASSIFICATION (LDL):  <100     mg/dL   Optimal  [id number]  mg/dL   Near or Above                    Optimal  130-159  mg/dL   Borderline  [id number]  mg/dL   High  >956>190     mg/dL   Very High

## 2014-12-13 NOTE — Assessment & Plan Note (Signed)
Convinced patient to follow neurology advice and start plavix  Discussed easy bruising of skin she is willing to try instead of ASA

## 2014-12-13 NOTE — Patient Instructions (Signed)
Your physician recommends that you schedule a follow-up appointment in: NEXT  AV[facility]ABLE  [name]  Your physician has recommended you make the following change in your medication:  STOP  ASPIRIN START  GEN PLAVIX  75 MG  EVERY DAY   DECREASE CARVEDILOL  TO  6.25 MG  TWICE  DAILY

## 2015-01-02 NOTE — Progress Notes (Signed)
Patient ID: Kathy DolphinOletta Britain, female   DOB: 11-Nov-1940, 74 y.o.   MRN: [id number] Cyndie MullOletta returns in followup. She had previous stent to the LAD. She hada re-look and the stent was widely patent despite atypical chest pain.The patient continues to feel fatigued. I explained to her that herheart function was normal at 55%. She should not be having any fatigueor shortness of breath from her heart. She is not having any more chestpain. She needs to start exercising again. She is on a lower dose of HCTZ and crestor now. She has some plantar fascitis that is bothering her but otherwise nothing new Sees [facility] for scleroderma. No chest pain Atypical arm pain with office visit earlier in week no ECG changes. Muscular pain in shoulder and elbow. BP been running high Dr Asencion Partridgeamille Andy new primary   Stopped cozaar due to bad smelling urine   Home BP readings low   Some back pain  ? Fibromyalgia    [facility]  Not clear she had LLE pain and weakness.  Said she had CT/MRI but not put on additional antiplatlet and no carotid duplex done Seen by Dr Everlena CooperJaffe neurology more recently and suggested plavix instead of asa but patient did not want to change  2/16 convinced patient to change    F/U echo done  07/15/14  Reviewed EF normal moderate LAE and MAC with mild MR   ROS: Denies fever, malais, weight loss, blurry vision, decreased visual acuity, cough, sputum, SOB, hemoptysis, pleuritic pain, palpitaitons, heartburn, abdominal pain, melena, lower extremity edema, claudication, or rash.  All other systems reviewed and negative  General: Affect appropriate Healthy:  appears stated age HEENT: normal Neck supple with no adenopathy JVP normal no bruits no thyromegaly Lungs clear with no wheezing and good diaphragmatic motion Heart:  S1/S2 no murmur, no rub, gallop or click PMI normal Abdomen: benighn, BS positve, no tenderness, no AAA no bruit.  No HSM or HJR Distal pulses intact with  no bruits No edema Neuro non-focal Skin warm and dry No muscular weakness   Current Outpatient Prescriptions  Medication Sig Dispense Refill  . amLODipine (NORVASC) 5 MG tablet Take 5 mg by mouth daily.    . carvedilol (COREG) 6.25 MG tablet Take 1 tablet (6.25 mg total) by mouth 2 (two) times daily with a meal. 180 tablet 3  . Cholecalciferol (VITAMIN D3) 2000 UNITS capsule 1 tablet once a day    . clopidogrel (PLAVIX) 75 MG tablet Take 1 tablet (75 mg total) by mouth daily. 90 tablet 3  . montelukast (SINGULAIR) 10 MG tablet Take 10 mg by mouth.    . naproxen sodium (ANAPROX) 220 MG tablet Take 220 mg by mouth daily.    . nitroGLYCERIN (NITROSTAT) 0.4 MG SL tablet Place 1 tablet (0.4 mg total) under the tongue every 5 (five) minutes as needed. 25 tablet 4  . rosuvastatin (CRESTOR) 10 MG tablet Take 10 mg by mouth.     No current facility-administered medications for this visit.    Allergies  Review of patient's allergies indicates no known allergies.  Electrocardiogram: 10/11/13 SB rate 58 LAD nonspecific T wave changes   12/13/14  SB rate 47  LAD    Assessment and Plan

## 2015-01-03 ENCOUNTER — Encounter: Payer: Self-pay | Admitting: Cardiovascular Disease

## 2015-01-03 ENCOUNTER — Ambulatory Visit (INDEPENDENT_AMBULATORY_CARE_PROVIDER_SITE_OTHER): Payer: Medicare HMO | Admitting: Cardiovascular Disease

## 2015-01-03 VITALS — BP 130/80 | HR 67 | Ht 64.0 in | Wt 157.8 lb

## 2015-01-03 DIAGNOSIS — E785 Hyperlipidemia, unspecified: Secondary | ICD-10-CM

## 2015-01-03 DIAGNOSIS — I10 Essential (primary) hypertension: Secondary | ICD-10-CM | POA: Diagnosis not present

## 2015-01-03 DIAGNOSIS — M349 Systemic sclerosis, unspecified: Secondary | ICD-10-CM

## 2015-01-03 DIAGNOSIS — I2510 Atherosclerotic heart disease of native coronary artery without angina pectoris: Secondary | ICD-10-CM

## 2015-01-03 DIAGNOSIS — G458 Other transient cerebral ischemic attacks and related syndromes: Secondary | ICD-10-CM

## 2015-01-03 NOTE — Assessment & Plan Note (Signed)
Labs followed by primary   Lab Results  Component Value Date   LDLCALC * 05/27/2009    160        Total Cholesterol/HDL:CHD Risk Coronary Heart Disease Risk Table                     Men   Women  1/2 Average Risk   3.4   3.3  Average Risk       5.0   4.4  2 X Average Risk   9.6   7.1  3 X Average Risk  23.4   11.0        Use the calculated Patient Ratio above and the CHD Risk Table to determine the patient's CHD Risk.        ATP III CLASSIFICATION (LDL):  <100     mg/dL   Optimal  [id number]  mg/dL   Near or Above                    Optimal  130-159  mg/dL   Borderline  [id number]  mg/dL   High  >696>190     mg/dL   Very High

## 2015-01-03 NOTE — Assessment & Plan Note (Signed)
Non recurrent taking plavix  F/u neuro no significant carotid disease

## 2015-01-03 NOTE — Assessment & Plan Note (Signed)
Stable with no angina and good activity level.  Continue medical Rx  

## 2015-01-03 NOTE — Assessment & Plan Note (Signed)
Multiple somatic complaints ? Related  She thinks she may have fibromyalgia  F/u primary

## 2015-01-03 NOTE — Assessment & Plan Note (Signed)
Getting low readings at home Told her its ok to hold amlodipine if needed but not symptomatic

## 2015-01-03 NOTE — Patient Instructions (Signed)
Your physician wants you to follow-up in: YEAR WITH DR NISHAN  You will receive a reminder letter in the mail two months in advance. If you don't receive a letter, please call our office to schedule the follow-up appointment.  Your physician recommends that you continue on your current medications as directed. Please refer to the Current Medication list given to you today. 

## 2015-02-13 ENCOUNTER — Other Ambulatory Visit: Payer: Self-pay | Admitting: Cardiovascular Disease

## 2015-02-13 NOTE — Telephone Encounter (Signed)
Is the patient still to be taking this? It was removed from her list as completed course on 12/13/14. Please advise. Thanks, MI

## 2015-03-02 ENCOUNTER — Other Ambulatory Visit: Payer: Self-pay | Admitting: Dermatology

## 2015-05-01 ENCOUNTER — Other Ambulatory Visit: Payer: Self-pay

## 2015-12-06 ENCOUNTER — Other Ambulatory Visit: Payer: Self-pay | Admitting: Cardiovascular Disease

## 2015-12-29 ENCOUNTER — Ambulatory Visit (INDEPENDENT_AMBULATORY_CARE_PROVIDER_SITE_OTHER): Payer: Medicare Other | Admitting: Cardiology

## 2015-12-29 ENCOUNTER — Encounter: Payer: Self-pay | Admitting: Cardiology

## 2015-12-29 VITALS — BP 150/74 | HR 56 | Ht 64.0 in | Wt 157.0 lb

## 2015-12-29 DIAGNOSIS — I2510 Atherosclerotic heart disease of native coronary artery without angina pectoris: Secondary | ICD-10-CM | POA: Diagnosis not present

## 2015-12-29 NOTE — Patient Instructions (Addendum)
Medication Instructions:  KEEP TAKING THE AMLODIPINE 5 MG IN THE EVENING   Labwork: NONE  Testing/Procedures: NONE  Follow-Up: 1. Your physician wants you to follow-up in: 6 MONTHS WITH DR. Eden Emms. You will receive a reminder letter in the mail two months in advance. If you don't receive a letter, please call our office to schedule the follow-up appointment.  2. YOU WILL NEED AN APPT WITH OUR BLOOD PRESSURE CLINIC IN THE NEXT 1-2 WEEK  Any Other Special Instructions Will Be Listed Below (If Applicable). CHECK YOUR BP THREE TIMES A DAY FOR 1 WEEK AND CALL READINGS TO BRITTANY SIMMONS, PAC [phone]  If you need a refill on your cardiac medications before your next appointment, please call your pharmacy.

## 2015-12-29 NOTE — Progress Notes (Signed)
12/29/2015 Kathy George   [birth year]  [id number]  Primary Physician ANDY,CAMILLE L, MD Primary Cardiologist: Eden Emms   Reason for Visit/CC: Routine f/u for CAD  HPI:  75 y/o female, followed by Dr. Eden Emms, who presents to clinic for routine f/u for CAD. In 11/2007 she underwent stenting of her proximal LAD with a DES. The stenosis was reduced from 80 -0%. LVEF was normal. Her last 2D echo was 07/2014. EF was 55-60%. Wall motion was normal. G1DD was noted. There was no significant valvular abnormalities. She also has a h/o HTN and HLD and prior TIA. Doppler studies following TIA showed no significant carotid artery disease. She is on Plaivx, Crestor, Coreg and amlodipine. She was last seen by Dr. Eden Emms 1 year ago, on 01/03/15 and was noted to be stable from a cardiac standpoint.   She now presents back to clinic for yearly f/u. She reports that she has done well. She denies any chest pain, dyspnea, orthopnea, PND, LEE, dizziness, palpitations, syncope/ near syncope. No recurrent TIA symptoms. She reports full medication compliance. Her only concern is her BP. She reports that she stopped amlodipine in the past but had to restart as her BP had increased into the 170s-200s systolic. She started back taking 5 mg of amlodipine at night. She continues to have occasional higher pressures in the day. She has been fully complaint with Coreg. She does not smoke. She drinks 1-2 cups of coffee in the mornings. No excessive sodium in her diet. She drinks socially, occasionally having a glass of wine or 2. Her BP today in clinic is 150/74.   Current Outpatient Prescriptions  Medication Sig Dispense Refill  . amLODipine (NORVASC) 5 MG tablet Take 5 mg by mouth daily.    . carvedilol (COREG) 6.25 MG tablet TAKE 1 TABLET BY MOUTH TWICE A DAY WITH FOOD 180 tablet 0  . Cholecalciferol (VITAMIN D3) 2000 UNITS capsule 1 tablet once a day    . clopidogrel (PLAVIX) 75 MG tablet TAKE 1 TABLET (75 MG TOTAL) BY MOUTH DAILY.  90 tablet 0  . esomeprazole (NEXIUM) 40 MG capsule 40 mg once.    . nitroGLYCERIN (NITROSTAT) 0.4 MG SL tablet Place 1 tablet (0.4 mg total) under the tongue every 5 (five) minutes as needed. 25 tablet 4  . rosuvastatin (CRESTOR) 10 MG tablet Take 10 mg by mouth.     No current facility-administered medications for this visit.    No Known Allergies  Social History   Social History  . Marital Status: Divorced    Spouse Name: N/A  . Number of Children: Y  . Years of Education: some colle   Occupational History  . 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 Smoking status: Former Smoker -- 0.10 packs/day for 1 years    Types: Cigarettes    Quit date: 11/04/1960  . Smokeless tobacco: Not on file     Comment: only smoked socially in early 20s  . Alcohol Use: Yes     Comment: socially  . Drug Use: No  . Sexual Activity: No   Other Topics Concern  . Not on file   Social History Narrative   Lives with her boyfriend, Marijean Niemann     Review of Systems: General: negative for chills, fever, night sweats or weight changes.  Cardiovascular: negative for chest pain, dyspnea on exertion, edema, orthopnea, palpitations, paroxysmal nocturnal dyspnea or shortness of breath Dermatological:  negative for rash Respiratory: negative for cough or wheezing Urologic: negative for hematuria Abdominal: negative for nausea, vomiting, diarrhea, bright red blood per rectum, melena, or hematemesis Neurologic: negative for visual changes, syncope, or dizziness All other systems reviewed and are otherwise negative except as noted above.    Blood pressure 150/74, pulse 56, height  (1.626 m), weight 157 lb (71.215 kg).  General appearance: alert, cooperative and no distress Neck: no carotid bruit and no JVD Lungs: clear to auscultation bilaterally Heart: regular rate and rhythm, S1, S2 normal, no murmur, click, rub or gallop Extremities: no LEE Pulses: 2+  and symmetric Skin: warm and dry Neurologic: Grossly normal  EKG Sinus bradycardia 51 bpm no ischemia.   ASSESSMENT AND PLAN:   1. CAD: s/p LAD stenting. She denies any chest pain. No exertional symptoms. EKG w/o signs of ischemia. Continue medical therapy.   2. HTN: moderately elevated. She has baseline bradycardia with HR in the low 50s, thus we are unable to increase her Coreg. We discussed increasing her amlodipine, but dividing dose up/ BID dosing to avoid hypotension. I advised that she check her BP TID x 1 week and to keep a log to assess BP trend. She will present back in 1-2 weeks for BP check with pharmacist, who can further adjust her meds if needed.   3. HLD: on Crestor. Followed by PCP, Dr. Mardelle Matte. Patient reports this was checked in Dec and was well controlled. We will try to obtain records from PCP office.   PLAN  F/u with Dr. Eden Emms in 6 months.   Robbie Lis PA-C 12/29/2015 9:26 AM

## 2016-01-02 ENCOUNTER — Ambulatory Visit: Payer: Medicare Other | Admitting: Pharmacist

## 2016-01-03 ENCOUNTER — Ambulatory Visit: Payer: Medicare Other | Admitting: Pharmacist

## 2016-01-10 ENCOUNTER — Ambulatory Visit: Payer: Medicare Other | Admitting: Pharmacist

## 2016-01-18 ENCOUNTER — Ambulatory Visit (INDEPENDENT_AMBULATORY_CARE_PROVIDER_SITE_OTHER): Payer: Medicare Other | Admitting: Pharmacist

## 2016-01-18 VITALS — BP 146/78 | HR 58

## 2016-01-18 DIAGNOSIS — I10 Essential (primary) hypertension: Secondary | ICD-10-CM | POA: Diagnosis not present

## 2016-01-18 MED ORDER — AMLODIPINE BESYLATE 5 MG PO TABS: 5.0000 mg | ORAL_TABLET | Freq: Two times a day (BID) | ORAL | Status: DC

## 2016-01-18 MED ORDER — EZETIMIBE 10 MG PO TABS: 10.0000 mg | ORAL_TABLET | Freq: Every day | ORAL | Status: DC

## 2016-01-18 NOTE — Progress Notes (Signed)
Patient ID: Kathy George                 DOB: 1941/04/20, 75 yo                         MRN: [id number]     HPI: Kathy George is a 75 y.o. female referred by Robbie Lis, PA to HTN clinic. PMH is significant for CAD s/p DES in 2009, HLD, HTN, and prior TIA. Pt has been keeping a log of her BP readings over the past few weeks. She reports taking amlodipine 2.5mg  at lunch and  in the evening. She has some slight swelling in her ankles but reports that this is not bothersome. She has some sweating overnight that she thinks is related to her carvedilol.  Home cuff: 141/81, pulse 58 Clinic reading: 146/78  Current HTN meds: carvedilol 6.25mg  BID, amlodipine 2.5mg  at lunch and  at night Previously tried: n/a BP goal: <150/60mmHg  Family History: Grandfather with CAD, daughter with HLD, mother with CKD, grandmother with ovarian cancer.  Social History: Used to smoke socially in her 71s but quit in 1962. Drinks alcohol socially and denies drug use.  Diet: Eats cheerios with fruit for breakfast with 2% milk. Does put salt on her eggs, is trying to find low sodium soups at the store. Likes milkshakes but watches her portions. Used to be gluten free but recently started eating it again. Made cookies the other day and ate 11 of them in a day.  Exercise: Has a treadmill but doesn't use it.   Home BP readings: 122/65 to 161/85, more consistently has systolic readings in 140s-160s.  Labs: 10/2015: TC 179, TG 102, HDL 62, LDL 97  - faxed from PCP                SCr = 1, K = 4.3, Na = 136  Wt Readings from Last 3 Encounters:  12/29/15 157 lb (71.215 kg)  01/03/15 157 lb 12.8 oz (71.578 kg)  12/13/14 163 lb 12.8 oz (74.299 kg)   BP Readings from Last 3 Encounters:  12/29/15 150/74  01/03/15 130/80  12/13/14 130/70   Pulse Readings from Last 3 Encounters:  12/29/15 56  01/03/15 67  12/13/14 47    Renal function: CrCl cannot be calculated (Unknown ideal weight.).  Past Medical  History  Diagnosis Date  . Paroxysmal atrial fibrillation (HCC)   . Anemia   . Raynaud's disease   . Depression   . Anxiety   . Dyslipidemia   . Supraventricular tachycardia (HCC)   . Hypertension   . Hiatal hernia   . GERD (gastroesophageal reflux disease)   . H/O: hysterectomy   . CAD (coronary artery disease)     successful IVUs guided PCI of the lesion in the  proximal LAD using a promus drug-eluting stent with improvement in Fentanyl narrowing from 80% to 0%  . SUPRAVENTRICULAR TACHYCARDIA   . Scleroderma [facility])     Current Outpatient Prescriptions on File Prior to Visit  Medication Sig Dispense Refill  . amLODipine (NORVASC) 5 MG tablet Take 5 mg by mouth daily.    . carvedilol (COREG) 6.25 MG tablet TAKE 1 TABLET BY MOUTH TWICE A DAY WITH FOOD 180 tablet 0  . Cholecalciferol (VITAMIN D3) 2000 UNITS capsule 1 tablet once a day    . clopidogrel (PLAVIX) 75 MG tablet TAKE 1 TABLET (75 MG TOTAL) BY MOUTH DAILY. 90 tablet 0  . esomeprazole (NEXIUM)  40 MG capsule 40 mg once.    . nitroGLYCERIN (NITROSTAT) 0.4 MG SL tablet Place 1 tablet (0.4 mg total) under the tongue every 5 (five) minutes as needed. 25 tablet 4  . rosuvastatin (CRESTOR) 10 MG tablet Take 10 mg by mouth.     No current facility-administered medications on file prior to visit.    No Known Allergies   Assessment/Plan:  1. HTN - BP above goal <150/7390mmHg about half of the time. She does have some lower readings in the morning with fluctuations throughout the day - she has been taking amlodipine BID to help with this. Will increase her afternoon dose of amlodipine to 5mg  and keep night time dose at 5mg . Advised her to try compression stockings to help with slight swelling in ankles. If this becomes bothersome for pt, would d/c amlodipine. Pt will continue to monitor BP at home. Recheck in HTN clinic in 1 month. If still high, could add on prn clonidine to target isolated high readings.  2. Hyperlipidemia - labs  faxed from PCP and LDL was 92. Goal <70 given CAD and prior TIA. Pt reports myalgias on higher dose of Crestor. Sent Zetia 10mg  once daily to the pharmacy - she called back an hour later saying that her insurance does not cover it and copay is a few hundred dollars. Advised her to follow up with the pharmacy in a few months where there are more generics available - price should come down. Pt will work to watch her portions and start using her treadmill more.   Sabien Umland E. Tyisha Cressy, PharmD, CPP [facility] [address], [place], [place] [address] Phone: [phone]; Fax: [phone] 01/18/2016 10:41 AM

## 2016-01-18 NOTE — Patient Instructions (Addendum)
Start taking amlodipine 5mg  at lunch and 5mg  before bedtime.  Continue to monitor your BP Pick up your prescription for Zetia 10mg  to start taking once a day - this will help to lower your LDL cholesterol. I will recheck your blood pressure in a month

## 2016-02-22 ENCOUNTER — Ambulatory Visit (INDEPENDENT_AMBULATORY_CARE_PROVIDER_SITE_OTHER): Payer: Medicare Other | Admitting: Pharmacist

## 2016-02-22 VITALS — BP 124/74 | HR 53

## 2016-02-22 DIAGNOSIS — I10 Essential (primary) hypertension: Secondary | ICD-10-CM

## 2016-02-22 DIAGNOSIS — I2510 Atherosclerotic heart disease of native coronary artery without angina pectoris: Secondary | ICD-10-CM | POA: Diagnosis not present

## 2016-02-22 DIAGNOSIS — E785 Hyperlipidemia, unspecified: Secondary | ICD-10-CM | POA: Diagnosis not present

## 2016-02-22 NOTE — Patient Instructions (Signed)
Continue taking your medications as directed.  Check your blood pressure ~once a day and call if you notice any huge changes with your readings  Continue taking your Crestor and Zetia. Come in for lab work to recheck your cholesterol on Thursday, June 8th. Lab opens at 7:30am, come in any time after for fasting lipid panel.

## 2016-02-22 NOTE — Progress Notes (Signed)
Patient ID: Kathy George                 DOB: [birth year], 75 yo                         MRN: [id number]     HPI: Kathy George is a 75 y.o. female referred by Robbie Lis, PA to HTN clinic and she is presenting today for follow-up. PMH is significant for CAD s/p DES in 2009, HLD, HTN, and prior TIA.    She has been checking her BP daily and recording in her BP log. She is currently taking her amlodipine 5 mg once daily in the evenings (instead of amlodipine 5 mg BID as prescribed at last visit) because she noticed a systolic reading of 106. She states her BP has been improved since her last visit, even with taking the amlodipine  once daily. She states that she does still have some fluid in her ankles/legs (this has been chronic) but it is not bothersome at all to her. Have previously discussed using compression stockings or elevating her feet. She has been advised to contact clinic if swelling becomes bothersome.  She states that her Zetia was going to be ~$500 for the generic, but she was able to get the brand for $3 and has started taking it. She denies side effects after starting the Zetia in addition to her Crestor.   Current HTN meds: carvedilol 6.25mg  BID, amlodipine  once daily  Previously tried: n/a  BP goal: <150/44mmHg  Family History: Grandfather with CAD, daughter with HLD, mother with CKD, grandmother with ovarian cancer.  Social History: Used to smoke socially in her 56s but quit in 1962. Drinks alcohol socially and denies drug use.  Diet: Reports no changes since last visit  Exercise: Reports no changes since last visit  Home BP readings:  SBP range 110-140s DBP range 60-70s Lowest BP 106/65.   HR range mid 50s-60s.   Labs:  10/2015: TC 179, TG 102, HDL 62, LDL 97 - faxed from PCP  SCr = 1, K = 4.3, Na = 136  Wt Readings from Last 3 Encounters:  12/29/15 157 lb (71.215 kg)  01/03/15 157 lb 12.8 oz (71.578 kg)  12/13/14 163 lb 12.8 oz  (74.299 kg)   BP Readings from Last 3 Encounters:  01/18/16 146/78  12/29/15 150/74  01/03/15 130/80   Pulse Readings from Last 3 Encounters:  01/18/16 58  12/29/15 56  01/03/15 67    Renal function: CrCl cannot be calculated (Unknown ideal weight.).  Past Medical History  Diagnosis Date  . Paroxysmal atrial fibrillation (HCC)   . Anemia   . Raynaud's disease   . Depression   . Anxiety   . Dyslipidemia   . Supraventricular tachycardia (HCC)   . Hypertension   . Hiatal hernia   . GERD (gastroesophageal reflux disease)   . H/O: hysterectomy   . CAD (coronary artery disease)     successful IVUs guided PCI of the lesion in the  proximal LAD using a promus drug-eluting stent with improvement in Fentanyl narrowing from 80% to 0%  . SUPRAVENTRICULAR TACHYCARDIA   . Scleroderma [facility])     Current Outpatient Prescriptions on File Prior to Visit  Medication Sig Dispense Refill  . amLODipine (NORVASC) 5 MG tablet Take 1 tablet (5 mg total) by mouth 2 (two) times daily. 180 tablet 3  . carvedilol (COREG) 6.25 MG tablet TAKE 1 TABLET BY MOUTH  TWICE A DAY WITH FOOD 180 tablet 0  . Cholecalciferol (VITAMIN D3) 2000 UNITS capsule 1 tablet once a day    . clopidogrel (PLAVIX) 75 MG tablet TAKE 1 TABLET (75 MG TOTAL) BY MOUTH DAILY. 90 tablet 0  . esomeprazole (NEXIUM) 40 MG capsule 40 mg once.    . ezetimibe (ZETIA) 10 MG tablet Take 1 tablet (10 mg total) by mouth daily. 90 tablet 3  . nitroGLYCERIN (NITROSTAT) 0.4 MG SL tablet Place 1 tablet (0.4 mg total) under the tongue every 5 (five) minutes as needed. 25 tablet 4  . rosuvastatin (CRESTOR) 10 MG tablet Take 10 mg by mouth.     No current facility-administered medications on file prior to visit.    No Known Allergies   Assessment/Plan:  1. HTN: BP at goal of <150/90 with clinic and home BP readings, currently on amlodipine 5 mg daily and carvedilol 6.25 mg BID. No changes to her regimen today. Encouraged her to continue to  check her BP once daily and write them in her BP log. Instructed her to call the clinic if she has any big changes with her BP readings at home.   2. Hyperlipidemia: Labs faxed from PCP from 10/2015 showed an LDL of 92 mg/dL. Goal <70 given CAD and prior TIA. Pt reports myalgias on higher dose of Crestor.  She continues on Crestor 10 mg daily and has been on Zetia 10 mg daily for one month. Will f/u fasting lipid panel in 2 months.    Hazle NordmannKelsy Combs, PharmD Pharmacy Resident

## 2016-04-03 ENCOUNTER — Other Ambulatory Visit: Payer: Self-pay | Admitting: Cardiovascular Disease

## 2016-04-11 ENCOUNTER — Other Ambulatory Visit (INDEPENDENT_AMBULATORY_CARE_PROVIDER_SITE_OTHER): Payer: Medicare Other | Admitting: *Deleted

## 2016-04-11 DIAGNOSIS — E785 Hyperlipidemia, unspecified: Secondary | ICD-10-CM

## 2016-04-11 LAB — HEPATIC FUNCTION PANEL
ALBUMIN: 4 g/dL (ref 3.6–5.1)
ALT: 13 U/L (ref 6–29)
AST: 19 U/L (ref 10–35)
Alkaline Phosphatase: 48 U/L (ref 33–130)
BILIRUBIN TOTAL: 0.5 mg/dL (ref 0.2–1.2)
Bilirubin, Direct: 0.1 mg/dL (ref <=0.2)
Indirect Bilirubin: 0.4 mg/dL (ref 0.2–1.2)
TOTAL PROTEIN: 6.8 g/dL (ref 6.1–8.1)

## 2016-04-11 LAB — LIPID PANEL
Cholesterol: 131 mg/dL (ref 125–200)
HDL: 72 mg/dL (ref >=46)
LDL CALC: 43 mg/dL (ref <130)
TRIGLYCERIDES: 79 mg/dL (ref <150)
Total CHOL/HDL Ratio: 1.8 Ratio (ref <=5.0)
VLDL: 16 mg/dL (ref <30)

## 2016-07-25 NOTE — Progress Notes (Signed)
08/01/2016 Kathy George   1941/08/01  [id number]  Primary Physician ANDY,CAMILLE L, MD Primary Cardiologist: Eden Emms   Reason for Visit/CC: Routine f/u for CAD  HPI:  75 y/o female who presents to clinic for routine f/u for CAD. In 11/2007 she underwent stenting of her proximal LAD with a DES. The stenosis was reduced from 80 -0%. LVEF was normal. Her last 2D echo was 07/2014. EF was 55-60%. Wall motion was normal. G1DD was noted. There was no significant valvular abnormalities. She also has a h/o HTN and HLD and prior TIA. Doppler studies following TIA showed no significant carotid artery disease. She is on Plaivx, Crestor, Coreg and amlodipine. She was last seen by Dr. Eden Emms 1 year ago, on 01/03/15 and was noted to be stable from a cardiac standpoint.   She now presents back to clinic for yearly f/u. She reports that she has done well. She denies any chest pain, dyspnea, orthopnea, PND, LEE, dizziness, palpitations, syncope/ near syncope. No recurrent TIA symptoms. She reports full medication compliance  She does not smoke. She drinks 1-2 cups of coffee in the mornings. No excessive sodium in her diet. She drinks socially, occasionally having a glass of wine or 2.    Some cyclical cough may be related to reflux no allergies   Current Outpatient Prescriptions  Medication Sig Dispense Refill  . amLODipine (NORVASC) 5 MG tablet Take 5 mg by mouth daily.    . Biotin 5000 MCG CAPS Take 5,000 mcg by mouth daily.    . carvedilol (COREG) 6.25 MG tablet TAKE 1 TABLET BY MOUTH TWICE A DAY WITH FOOD 180 tablet 2  . Cholecalciferol (VITAMIN D3) 2000 UNITS capsule 1 tablet once a day    . clopidogrel (PLAVIX) 75 MG tablet TAKE 1 TABLET (75 MG TOTAL) BY MOUTH DAILY. 90 tablet 0  . ezetimibe (ZETIA) 10 MG tablet Take 1 tablet (10 mg total) by mouth daily. 90 tablet 3  . nitroGLYCERIN (NITROSTAT) 0.4 MG SL tablet Place 1 tablet (0.4 mg total) under the tongue every 5 (five) minutes as needed. 25 tablet 1    . pantoprazole (PROTONIX) 20 MG tablet Take 20 mg by mouth daily.    . rosuvastatin (CRESTOR) 10 MG tablet Take 10 mg by mouth.     No current facility-administered medications for this visit.     No Known Allergies  Social History   Social History  . Marital status: Divorced    Spouse name: N/A  . Number of children: Y  . Years of education: some colle   Occupational History  . 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 travel agent    Social History Main Topics  . Smoking status: Former Smoker    Packs/day: 0.10    Years: 1.00    Types: Cigarettes    Quit date: 11/04/1960  . Smokeless tobacco: Never Used     Comment: only smoked socially in early 85s  . Alcohol use Yes     Comment: socially  . Drug use: No  . Sexual activity: No   Other Topics Concern  . Not on file   Social History Narrative   Lives with her boyfriend, Marijean Niemann     Review of Systems: General: negative for chills, fever, night sweats or weight changes.  Cardiovascular: negative for chest pain, dyspnea on exertion, edema, orthopnea, palpitations, paroxysmal nocturnal dyspnea or shortness of breath Dermatological: negative for rash Respiratory: negative for cough or wheezing Urologic: negative  for hematuria Abdominal: negative for nausea, vomiting, diarrhea, bright red blood per rectum, melena, or hematemesis Neurologic: negative for visual changes, syncope, or dizziness All other systems reviewed and are otherwise negative except as noted above.    Blood pressure 120/60, pulse (!) 57, height 5\' 4"  (1.626 m), weight 72.2 kg (159 lb 3.2 oz), SpO2 98 %.  General appearance: alert, cooperative and no distress Neck: no carotid bruit and no JVD Lungs: clear to auscultation bilaterally Heart: regular rate and rhythm, S1, S2 normal, no murmur, click, rub or gallop Extremities: no LEE Pulses: 2+ and symmetric Skin: warm and dry Neurologic: Grossly normal  EKG Sinus bradycardia 51 bpm low voltage  and flat ST segments   ASSESSMENT AND PLAN:   1. CAD: s/p LAD stenting 2009 . She denies any chest pain. No exertional symptoms. EKG w/o signs of ischemia. Continue medical therapy.   2. HTN :Labile  She has baseline bradycardia with HR in the low 50s, thus we are unable to increase her Coreg. Continue norvasc   3. HLD: on Crestor and Zetia  Followed by PCP, Dr. Mardelle MatteAndy. LDL 10/2015 92    PLAN F/u with me in a year    Charlton Hawseter Trystin Terhune

## 2016-08-01 ENCOUNTER — Ambulatory Visit (INDEPENDENT_AMBULATORY_CARE_PROVIDER_SITE_OTHER): Payer: Medicare Other | Admitting: Cardiovascular Disease

## 2016-08-01 ENCOUNTER — Encounter: Payer: Self-pay | Admitting: Cardiovascular Disease

## 2016-08-01 VITALS — BP 120/60 | HR 57 | Ht 64.0 in | Wt 159.2 lb

## 2016-08-01 DIAGNOSIS — I2510 Atherosclerotic heart disease of native coronary artery without angina pectoris: Secondary | ICD-10-CM

## 2016-08-01 MED ORDER — NITROGLYCERIN 0.4 MG SL SUBL: 0.4000 mg | SUBLINGUAL_TABLET | SUBLINGUAL | 1 refills | Status: DC | PRN

## 2016-08-01 NOTE — Patient Instructions (Addendum)

## 2016-09-18 ENCOUNTER — Other Ambulatory Visit: Payer: Self-pay | Admitting: Family Medicine

## 2016-09-18 ENCOUNTER — Ambulatory Visit
Admission: RE | Admit: 2016-09-18 | Discharge: 2016-09-18 | Disposition: A | Discharge status: Home / Self Care | Payer: Medicare Other | Source: Ambulatory Visit | Attending: Family Medicine | Admitting: Family Medicine

## 2016-09-18 DIAGNOSIS — G8929 Other chronic pain: Secondary | ICD-10-CM

## 2016-09-18 DIAGNOSIS — M546 Pain in thoracic spine: Principal | ICD-10-CM

## 2016-10-09 ENCOUNTER — Other Ambulatory Visit: Payer: Self-pay | Admitting: Cardiovascular Disease

## 2016-10-17 ENCOUNTER — Telehealth: Payer: Self-pay | Admitting: Cardiovascular Disease

## 2016-10-17 DIAGNOSIS — R000 Tachycardia, unspecified: Secondary | ICD-10-CM

## 2016-10-17 NOTE — Telephone Encounter (Signed)
Received incoming call. Pt reports HR 137 this morning. Pt stated her HR increased 2 days ago  - HR 100 on 12/13, HR 125 on 12/12. PT denies CP or SOB. Pt stated she has a headache and her head feels "fuzzy". Pt also has pain in upper back. Pt had recent x-ray by PCP, which showed mild spinal curvature. I gave pt instructions how to obtain a radial pulse. Radial pulse at 10:45 AM was 84. Pt stated rate feels regular (no skipped beat). Pt  Stated radial pulse was after pt took her morning meds. Will for ward to Dr. Eden EmmsNishan to advise.

## 2016-10-17 NOTE — Telephone Encounter (Signed)
Can have event monitor if symptoms persist. If HR high take extra coreg She has no history of PAF or arrhythmia

## 2016-10-17 NOTE — Telephone Encounter (Signed)
New Message:    Extremely high pulse(137),pt says she is scared.

## 2016-10-17 NOTE — Telephone Encounter (Signed)
Called patient back with Dr. Fabio BeringNishan's recommendations. Will order event monitor for patient and have scheduling call her with an appointment. Patient verbalized understanding.

## 2016-10-18 ENCOUNTER — Telehealth: Payer: Self-pay | Admitting: Cardiovascular Disease

## 2016-10-18 NOTE — Telephone Encounter (Signed)
Patient stated that she thinks her BP machine was not working right and she thinks she had a panic attack. Patient stated she does not need a cardiac monitor at this time. Encouraged patient to give our office a call if she has any more questions or concerns.

## 2016-10-18 NOTE — Telephone Encounter (Signed)
10/18/2016 pt said she doesn't think she needs the monitor anymore, what she was using was not working correctly.  The patient said she is going to call the nurse to let her know to cancel the monitor.lm

## 2016-12-15 ENCOUNTER — Other Ambulatory Visit: Payer: Self-pay | Admitting: Cardiovascular Disease

## 2017-01-10 ENCOUNTER — Other Ambulatory Visit: Payer: Self-pay | Admitting: Cardiovascular Disease

## 2017-01-10 MED ORDER — AMLODIPINE BESYLATE 5 MG PO TABS: 5.0000 mg | ORAL_TABLET | Freq: Two times a day (BID) | ORAL | 1 refills | Status: DC

## 2017-06-06 ENCOUNTER — Encounter: Payer: Self-pay | Admitting: Cardiovascular Disease

## 2017-06-06 ENCOUNTER — Ambulatory Visit (INDEPENDENT_AMBULATORY_CARE_PROVIDER_SITE_OTHER): Payer: Medicare Other | Admitting: Cardiovascular Disease

## 2017-06-06 VITALS — BP 138/80 | HR 53 | Ht 64.0 in | Wt 162.4 lb

## 2017-06-06 DIAGNOSIS — I2510 Atherosclerotic heart disease of native coronary artery without angina pectoris: Secondary | ICD-10-CM

## 2017-06-06 MED ORDER — NITROGLYCERIN 0.4 MG SL SUBL
0.4000 mg | SUBLINGUAL_TABLET | SUBLINGUAL | 2 refills | Status: DC | PRN
Start: 2017-06-06 — End: 2018-07-29

## 2017-06-06 NOTE — Patient Instructions (Addendum)

## 2017-06-06 NOTE — Progress Notes (Signed)
06/06/2017 Karleen DolphinOletta Volpe   1941-02-06  [id number]  Primary Physician Willow OraAndy, Camille L, MD Primary Cardiologist: Eden EmmsNishan   Reason for Visit/CC: Routine f/u for CAD  HPI:  76 y.o.  female who presents to clinic for routine f/u for CAD. In 11/2007 she underwent stenting of her proximal LAD with a DES. The stenosis was reduced from 80 -0%. LVEF was normal. Her last 2D echo was 07/2014. EF was 55-60%. Wall motion was normal. G1DD was noted. There was no significant valvular abnormalities. She also has a h/o HTN and HLD and prior TIA. Doppler studies following TIA showed no significant carotid artery disease. She is on Plaivx, Crestor, Coreg and amlodipine. She was last seen by Dr. Eden EmmsNishan 1 year ago, on 01/03/15 and was noted to be stable from a cardiac standpoint.   She now presents back to clinic for yearly f/u. She reports that she has done well. She denies any chest pain, dyspnea, orthopnea, PND, LEE, dizziness, palpitations, syncope/ near syncope. No recurrent TIA symptoms. She reports full medication compliance  She does not smoke. She drinks 1-2 cups of coffee in the mornings. No excessive sodium in her diet. She drinks socially, occasionally having a glass of wine or 2.   She is more sedentary and not walking She is helping a friend move into a house in [place]  Has a grandson that is brilliant in math and likely going to [employer] [place]   Some cyclical cough may be related to reflux no allergies  Recent antibiotic Rx for Cystitis   Current Outpatient Prescriptions  Medication Sig Dispense Refill  . carvedilol (COREG) 6.25 MG tablet TAKE 1 TABLET BY MOUTH TWICE A DAY WITH FOOD 180 tablet 2  . Cholecalciferol (VITAMIN D3) 2000 UNITS capsule 1 tablet once a day    . clopidogrel (PLAVIX) 75 MG tablet TAKE 1 TABLET (75 MG TOTAL) BY MOUTH DAILY. 90 tablet 0  . meloxicam (MOBIC) 7.5 MG tablet Take 7.5 mg by mouth as directed.     . nitroGLYCERIN (NITROSTAT) 0.4 MG SL tablet Place 1 tablet (0.4 mg  total) under the tongue every 5 (five) minutes as needed. 25 tablet 1  . pantoprazole (PROTONIX) 20 MG tablet Take 20 mg by mouth daily.    . rosuvastatin (CRESTOR) 10 MG tablet Take 10 mg by mouth.     No current facility-administered medications for this visit.     No Known Allergies  Social History   Social History  . Marital status: Divorced    Spouse name: N/A  . Number of children: Y  . Years of education: some colle   Occupational History  . Realtor  Retired    Loss adjuster, [employer]  . travel agent    Social History Main Topics  . Smoking status: Former Smoker    Packs/day: 0.10    Years: 1.00    Types: Cigarettes    Quit date: 11/04/1960  . Smokeless tobacco: Never Used     Comment: only smoked socially in early 3420s  . Alcohol use Yes     Comment: socially  . Drug use: No  . Sexual activity: No   Other Topics Concern  . Not on file   Social History Narrative   Lives with her boyfriend, [name]     Review of Systems: General: negative for chills, fever, night sweats or weight changes.  Cardiovascular: negative for chest pain, dyspnea on exertion, edema, orthopnea, palpitations, paroxysmal nocturnal dyspnea or shortness of breath Dermatological: negative  for rash Respiratory: negative for cough or wheezing Urologic: negative for hematuria Abdominal: negative for nausea, vomiting, diarrhea, bright red blood per rectum, melena, or hematemesis Neurologic: negative for visual changes, syncope, or dizziness All other systems reviewed and are otherwise negative except as noted above.    Blood pressure 138/80, pulse (!) 53, height 5\' 4"  (1.626 m), weight 162 lb 6.4 oz (73.7 kg).  Affect appropriate Healthy:  appears stated age HEENT: normal Neck supple with no adenopathy JVP normal no bruits no thyromegaly Lungs clear with no wheezing and good diaphragmatic motion Heart:  S1/S2 no murmur, no rub, gallop or click PMI normal Abdomen: benighn, BS positve, no  tenderness, no AAA no bruit.  No HSM or HJR Distal pulses intact with no bruits No edema Neuro non-focal Skin warm and dry No muscular weakness   EKG Sinus bradycardia 51 bpm low voltage and flat ST segments   ASSESSMENT AND PLAN:   1. CAD: s/p LAD stenting 2009 . She denies any chest pain. No exertional symptoms. EKG w/o signs of ischemia. Continue medical therapy. She declines stress testing again   2. HTN :Labile  She has baseline bradycardia with HR in the low 50s, thus we are unable to increase her Coreg. Continue norvasc   3. HLD: on Crestor and Zetia  Followed by PCP, Dr. Mardelle MatteAndy. LDL 10/2015 92    PLAN F/u with me in a year    Charlton Hawseter Maynard David

## 2017-07-17 ENCOUNTER — Encounter: Payer: Self-pay | Admitting: Internal Medicine

## 2018-07-29 ENCOUNTER — Ambulatory Visit (INDEPENDENT_AMBULATORY_CARE_PROVIDER_SITE_OTHER): Payer: Medicare HMO | Admitting: Medical

## 2018-07-29 ENCOUNTER — Encounter: Payer: Self-pay | Admitting: Medical

## 2018-07-29 VITALS — BP 130/90 | HR 60 | Ht 63.0 in | Wt 162.8 lb

## 2018-07-29 DIAGNOSIS — R55 Syncope and collapse: Secondary | ICD-10-CM

## 2018-07-29 DIAGNOSIS — R079 Chest pain, unspecified: Secondary | ICD-10-CM

## 2018-07-29 DIAGNOSIS — I25119 Atherosclerotic heart disease of native coronary artery with unspecified angina pectoris: Secondary | ICD-10-CM

## 2018-07-29 DIAGNOSIS — E785 Hyperlipidemia, unspecified: Secondary | ICD-10-CM

## 2018-07-29 DIAGNOSIS — I10 Essential (primary) hypertension: Secondary | ICD-10-CM | POA: Diagnosis not present

## 2018-07-29 MED ORDER — AMLODIPINE BESYLATE 5 MG PO TABS: 5.0000 mg | ORAL_TABLET | Freq: Every day | ORAL | 6 refills | Status: DC

## 2018-07-29 MED ORDER — NITROGLYCERIN 0.4 MG SL SUBL: 0.4000 mg | SUBLINGUAL_TABLET | SUBLINGUAL | 3 refills | Status: DC | PRN

## 2018-07-29 MED ORDER — ROSUVASTATIN CALCIUM 5 MG PO TABS: 5.0000 mg | ORAL_TABLET | Freq: Every day | ORAL | 3 refills | Status: DC

## 2018-07-29 NOTE — Patient Instructions (Addendum)
We will be checking the following labs today - None   Medication Instructions:    Please stop your carvedilol  Start amlodipine 5mg  daily today  You can decrease your crestor dose to 5mg  daily   Continue with your current medicines.     Testing/Procedures To Be Arranged:  N/A  Follow-Up:   See Dr. Eden Emms in 6 weeks after Monitor is complete.    Other Special Instructions:   Continue to monitor you blood pressure routinely and keep a log. Bring your blood pressure log with you to your next follow-up visit     You have a Stress Test scheduled at [facility]. Your doctor has ordered this test to check the blood flow in your heart arteries.  Please arrive 15 minutes early for paperwork. The whole test will take several hours. You may want to bring reading material to remain occupied while undergoing different parts of the test.  Instructions: No food/drink after midnight the night before. It is OK to take your morning meds with a sip of water EXCEPT for those types of medicines listed below or otherwise instructed. No caffeine/decaf products 24 hours before, including medicines such as Excedrin or Goody Powders. Call if there are any questions.  Wear comfortable clothes and shoes.   What To Expect: When you arrive in the lab, the technician will inject a small amount of radioactive tracer into your arm through an IV while you are resting quietly. This helps [place] to form pictures of your heart. You will likely only feel a sting from the IV. After a waiting period, resting pictures will be obtained under a big camera. These are the "before" pictures.  Next, you will be prepped for the stress portion of the test. This may include either walking on a treadmill or receiving a medicine that helps to dilate blood vessels in your heart to simulate the effect of exercise on your heart. If you are walking on a treadmill, you will walk at different paces to try to  get your heart rate to a goal number that is based on your age. If your doctor has chosen the pharmacologic test, then you will receive a medicine through your IV that may cause temporary nausea, flushing, shortness of breath and sometimes chest discomfort or vomiting. This is typically short-lived and usually resolves quickly. If you experience symptoms, that does not automatically mean the test is abnormal. Some patients do not experience any symptoms at all. Your blood pressure and heart rate will be monitored, and we will be watching your EKG on a computer screen for any changes. During this portion of the test, the radiologist will inject another small amount of radioactive tracer into your IV. After a waiting period, you will undergo a second set of pictures. These are the "after" pictures.   The doctor reading the test will compare the before-and-after images to look for evidence of heart blockages or heart weakness. The test usually takes 1 day to complete, but in certain instances (for example, if a patient is over a certain weight limit), the test may be done over the span of 2 days.     If you need a refill on your cardiac medications before your next appointment, please call your pharmacy.   Call the [facility] office at [phone] if you have any questions, problems or concerns.   Marland Kitchen

## 2018-07-29 NOTE — Progress Notes (Addendum)
Cardiology Office Note   Date:  07/29/2018   ID:  Kathy George, DOB [birth year], MRN [id number]  PCP:  Kathy George, David, MD  Cardiologist:  Kathy HawsPeter Nishan, MD   Chief Complaint  Patient presents with  . Dizziness  . Follow-up    CAD and HTN      History of Present Illness: Kathy George is a 77 y.o. female who presents for her annual follow-up visit for CAD and HTN.   She has a PMH of CAD s/p PCI/DES to pLAD in 2009, HTN, HLD, TIA. Doppler studies following TIA showed no significant carotid artery disease. She was last seen by Dr. Eden EmmsNishan 06/2017 for her annual follow-up visit and was thought to be doing well. She was last seen at her PCP office 07/21/18 and complained of episodes of dizziness and lightheadedness. Decision was made to discontinue her carvedilol at that time, however she has still been taking 3.125mg  BID.   She reports having episodes of pre-syncope a couple times a month which are bothersome. They occur at varying times of the day. She states she has to lay down immediately to avoid passing out. Her symptoms usually last ~5-10 minutes, however on Saturday 07/25/18 she had an episode while baking cookies that took ~1 hour to recover from. She denies associated CP, SOB, or palpitations. She has not had any syncopal events. She is worried she may be experiencing atrial fibrillation although has no documented history of this.   She has also been experiencing labile blood pressure readings at home with lowest reading 104/60 and highest reading 162/85. She states her carvedilol was recently decreased given dizziness/lightheadedness complaints.   She also complains of intermittent vague left sided chest pain which is non-radiating, lasts for a second, and is sharp in nature. This is non-exertional. She attributes them to her leaking breast implants which she had placed in her 30s. She has not had an ischemic evaluation since 2009, at which time she was noted to have a patent LAD stent  and mild left main and RCA disease. She has been recommended for a stress test but has refused in the past.     Past Medical History:  Diagnosis Date  . Anemia   . Anxiety   . CAD (coronary artery disease)    successful IVUs guided PCI of the lesion in the  proximal LAD using a promus drug-eluting stent with improvement in Fentanyl narrowing from 80% to 0%  . Depression   . Dyslipidemia   . GERD (gastroesophageal reflux disease)   . H/O: hysterectomy   . Hiatal hernia   . Hypertension   . Paroxysmal atrial fibrillation (HCC)   . Raynaud's disease   . Scleroderma (HCC)   . SUPRAVENTRICULAR TACHYCARDIA   . Supraventricular tachycardia [facility](HCC)     Past Surgical History:  Procedure Laterality Date  . BREAST ENHANCEMENT SURGERY    . right rotator cuff repair    . TOTAL ABDOMINAL HYSTERECTOMY       Current Outpatient Medications  Medication Sig Dispense Refill  . Cholecalciferol (VITAMIN D3) 2000 UNITS capsule 1 tablet once a day    . clopidogrel (PLAVIX) 75 MG tablet TAKE 1 TABLET (75 MG TOTAL) BY MOUTH DAILY. 90 tablet 0  . hydrochlorothiazide (HYDRODIURIL) 25 MG tablet TAKE ONE TABLET (25 MG DOSE) BY MOUTH DAILY.  1  . meloxicam (MOBIC) 7.5 MG tablet Take 7.5 mg by mouth as directed.     . nitroGLYCERIN (NITROSTAT) 0.4 MG SL tablet Place 1  tablet (0.4 mg total) under the tongue every 5 (five) minutes as needed. 25 tablet 3  . pantoprazole (PROTONIX) 20 MG tablet Take 20 mg by mouth daily.    Marland Kitchen amLODipine (NORVASC) 5 MG tablet Take 1 tablet (5 mg total) by mouth daily. 30 tablet 6  . rosuvastatin (CRESTOR) 5 MG tablet Take 1 tablet (5 mg total) by mouth daily. 90 tablet 3   No current facility-administered medications for this visit.     Allergies:   Patient has no known allergies.    Social History:  The patient  reports that she quit smoking about 57 years ago. Her smoking use included cigarettes. She has a 0.10 pack-year smoking history. She has never used smokeless  tobacco. She reports that she drinks alcohol. She reports that she does not use drugs.   Family History:  The patient's family history includes Coronary artery disease in her other; Hyperlipidemia in her daughter; Kidney disease in her mother; Ovarian cancer in her maternal grandmother; Rheum arthritis in her maternal aunt.    ROS:  Please see the history of present illness.   Otherwise, review of systems are positive for none.   All other systems are reviewed and negative.    PHYSICAL EXAM: VS:  BP 130/90 (BP Location: Left Arm, Patient Position: Sitting, Cuff Size: Normal)   Pulse 60   Ht 5\' 3"  (1.6 m)   Wt 162 lb 12.8 oz (73.8 kg)   BMI 28.84 kg/m  , BMI Body mass index is 28.84 kg/m. GEN: Well nourished, well developed, elderly female who appears younger than stated age. Sitting on exam table in no acute distress HEENT: sclera anicteric Neck: no JVD, carotid bruits, or masses Cardiac: RRR; no murmurs, rubs, or gallops, no edema  Respiratory:  clear to auscultation bilaterally, normal work of breathing GI: soft, nontender, nondistended, + BS MS: no deformity or atrophy Skin: warm and dry, no rash Neuro:  Strength and sensation are intact Psych: euthymic mood, full affect   EKG:  EKG is ordered today. The ekg ordered today demonstrates sinus rhythm, rate 60, 1 PVC, inferior Q waves (seen on previous), otherwise non-ischemic   Recent Labs: No results found for requested labs within last 8760 hours.    Lipid Panel    Component Value Date/Time   CHOL 131 04/11/2016 0935   TRIG 79 04/11/2016 0935   HDL 72 04/11/2016 0935   CHOLHDL 1.8 04/11/2016 0935   VLDL 16 04/11/2016 0935   LDLCALC 43 04/11/2016 0935      Wt Readings from Last 3 Encounters:  07/29/18 162 lb 12.8 oz (73.8 kg)  06/06/17 162 lb 6.4 oz (73.7 kg)  08/01/16 159 lb 3.2 oz (72.2 kg)      Other studies Reviewed: Additional studies/ records that were reviewed today include: None.    ASSESSMENT AND  PLAN:  1. Pre-syncope: patient reports pre-syncopal episodes that occur a couple times a month and are bothersome. She is agreeable to an event monitor at this time. She has bradycardia at baseline, possible this is contributing - Will stop carvedilol at this time - Will schedule an event monitor to evaluate for possible arrhythmia   2. CAD s/p PCI/DES to pLAD in 2009: describes vague atypical chest pain. Has not had an ischemic evaluation since 2009. Agreeable to a NST today - does not feel that she could walk on a treadmill. - Will schedule a NST to rule out cardiac etiology of chest pain - Continue plavix and statin.  3. HTN: BP labile on coreg and HCTZ per patients home BP log.  - Will stop carvedilol given pre-syncopal symptoms and baseline bradycardia - Will start amlodipine 5mg  daily today - Continue HCTZ  4. HLD: No recent lipids on file but she reports that they are monitored by her PCP, last in 11/2017 and were good. On crestor 10mg  daily but requesting to decrease dose to 5mg  daily given myalgias  - Will decrease crestor to 5mg  daily   Current medicines are reviewed at length with the patient today.  The patient does not have concerns regarding medicines.  The following changes have been made:  Stop carvedilol. Start amlodipine 5mg  daily. Decrease crestor to 5mg  daily  Labs/ tests ordered today include:   Orders Placed This Encounter  Procedures  . Cardiac event monitor  . MYOCARDIAL PERFUSION IMAGING  . EKG 12-Lead     Disposition:   FU with Dr. Eden Emms in 6 week (or after completion of event monitor and NST)  Signed, Beatriz Stallion, PA-C  07/29/2018 10:46 AM

## 2018-08-04 ENCOUNTER — Telehealth (HOSPITAL_COMMUNITY): Payer: Self-pay | Admitting: *Deleted

## 2018-08-04 NOTE — Telephone Encounter (Signed)
Patient given detailed instructions per Myocardial Perfusion Study Information Sheet for the test on 08/07/18 . Patient notified to arrive 15 minutes early and that it is imperative to arrive on time for appointment to keep from having the test rescheduled.  If you need to cancel or reschedule your appointment, please call the office within 24 hours of your appointment. . Patient verbalized understanding. Kathy George Jacqueline    

## 2018-08-07 ENCOUNTER — Ambulatory Visit (INDEPENDENT_AMBULATORY_CARE_PROVIDER_SITE_OTHER): Payer: Medicare HMO

## 2018-08-07 ENCOUNTER — Ambulatory Visit (HOSPITAL_COMMUNITY): Payer: Medicare HMO | Attending: Cardiology

## 2018-08-07 DIAGNOSIS — R109 Unspecified abdominal pain: Secondary | ICD-10-CM | POA: Insufficient documentation

## 2018-08-07 DIAGNOSIS — I2510 Atherosclerotic heart disease of native coronary artery without angina pectoris: Secondary | ICD-10-CM | POA: Insufficient documentation

## 2018-08-07 DIAGNOSIS — R55 Syncope and collapse: Secondary | ICD-10-CM

## 2018-08-07 DIAGNOSIS — R079 Chest pain, unspecified: Secondary | ICD-10-CM

## 2018-08-07 DIAGNOSIS — Z8673 Personal history of transient ischemic attack (TIA), and cerebral infarction without residual deficits: Secondary | ICD-10-CM | POA: Insufficient documentation

## 2018-08-07 DIAGNOSIS — I10 Essential (primary) hypertension: Secondary | ICD-10-CM | POA: Diagnosis not present

## 2018-08-07 LAB — MYOCARDIAL PERFUSION IMAGING
CHL CUP NUCLEAR SSS: 2
CHL CUP RESTING HR STRESS: 68 {beats}/min
CSEPPHR: 106 {beats}/min
LV sys vol: 8 mL
LVDIAVOL: 40 mL (ref 46–106)
SDS: 2
SRS: 0
TID: 0.93

## 2018-08-07 MED ORDER — TECHNETIUM TC 99M TETROFOSMIN IV KIT
32.6000 | PACK | Freq: Once | INTRAVENOUS | Status: AC | PRN
  Administered 2018-08-07: 32.6 via INTRAVENOUS
  Filled 2018-08-07: qty 33

## 2018-08-07 MED ORDER — TECHNETIUM TC 99M TETROFOSMIN IV KIT
10.7000 | PACK | Freq: Once | INTRAVENOUS | Status: AC | PRN
  Administered 2018-08-07: 10.7 via INTRAVENOUS
  Filled 2018-08-07: qty 11

## 2018-08-07 MED ORDER — REGADENOSON 0.4 MG/5ML IV SOLN
0.4000 mg | Freq: Once | INTRAVENOUS | Status: AC
  Administered 2018-08-07: 0.4 mg via INTRAVENOUS

## 2018-08-19 ENCOUNTER — Telehealth: Payer: Self-pay | Admitting: Cardiovascular Disease

## 2018-08-19 ENCOUNTER — Other Ambulatory Visit: Payer: Self-pay | Admitting: Medical

## 2018-08-19 DIAGNOSIS — R55 Syncope and collapse: Secondary | ICD-10-CM

## 2018-08-19 NOTE — Telephone Encounter (Signed)
New message   Patient states that she states that she has been wearing Holter monitor since 08/07/2018 and she states that she has had some episodes that are debilitating and she is trembling. Please call the patient to discuss.

## 2018-08-19 NOTE — Telephone Encounter (Signed)
Spoke to patient who is informing us that while wearing the Holter monitor (fitted 10/4) she is experiencing episodes consisting of fainting feeling, trembling and hard breathing with racing heart.    She says that there is no rhyme or reason behind these episodes occurring.  She was at a service station and felt the symptoms coming on.  They do not happen frequently throughout the day, usually only once.

## 2018-08-19 NOTE — Telephone Encounter (Signed)
   Patient called complaining of worsening pre-syncopal episodes. She states she has had 3 episodes in the past week which start with dizziness and are associated with racing heart beat sensations and SOB. She states it takes her 1-2 hours to recover from the episodes and she has a residual foggy headed sensation afterwards. She denies chest pain or actual syncope. She has been wearing an event monitor since 08/07/18 but there have been no tachy/brady arrhythmias reported. She reports good blood pressure control and no episodes of hypotension at home. Symptoms do not seem to be associated with positional changes. Given benign cardiac work-up at this point and ongoing/worsening symptoms, will refer her to Neurology for further evaluation of pre-syncope.   Patient very frustrated that no diagnosis has been made at this point but was agreeable to the plan. Will forward this message to Dr. Eden Emms who will be seeing her in follow-up 09/24/18.  Kathy Stallion, PA-C 08/19/18

## 2018-09-10 ENCOUNTER — Telehealth: Payer: Self-pay

## 2018-09-10 DIAGNOSIS — I480 Paroxysmal atrial fibrillation: Secondary | ICD-10-CM

## 2018-09-10 DIAGNOSIS — I471 Supraventricular tachycardia: Secondary | ICD-10-CM

## 2018-09-10 NOTE — Addendum Note (Signed)
Addended by: Virl Axe, Nakeeta Sebastiani L on: 09/10/2018 04:10 PM   Modules accepted: Orders

## 2018-09-10 NOTE — Telephone Encounter (Signed)
Called patient about monitor results. Patient stated she is a little worried about taking coreg. Patient was started on amlodipine 5 mg on 07/29/18. Patient stated she feel better now. Patient stated she is worried about taking coreg, because her BP runs low in the morning and she usually waits until later in the day to take her amlodipine. Patient stated this past week her BP has finally normalized. Informed patient that a message would be sent to Dr. Eden Emms for his advisement on her current issue. Will send a message to EP scheduler to call patient with an appointment.

## 2018-09-10 NOTE — Telephone Encounter (Signed)
-----   Message from Wendall Stade, MD sent at 09/09/2018  6:00 PM EST ----- Monitor shows episodes of PAF vs PAT not really correlated with her symptoms Restart coreg 3.125 bid f/u with EP for further evaluation

## 2018-09-10 NOTE — Telephone Encounter (Signed)
Can stick with what she's doing if she feels better make f/u with EP

## 2018-09-10 NOTE — Telephone Encounter (Signed)
Left detailed message on patient's cell phone stating it's okay for her to continue with her current medications as long as she is doing fine and feels better.

## 2018-09-20 NOTE — Progress Notes (Deleted)
Cardiology Office Note   Date:  09/20/2018   ID:  Kathy George, Kathy George 1941-02-24, MRN [id number]  PCP:  Tracey Harries, MD  Cardiologist:  Charlton Haws, MD   No chief complaint on file.     History of Present Illness:  77 y.o. f/u CAD, HTN, pre syncope , and PAT.  Had DES to LAD in 2009 Last myovue normal October 2019 reviewed EF 60% No RWMAls. No ischemia.  Dizziness not correlated with any arrhythmia on event monitor 08/07/18 Feels better with no hypotension when off beta blocker and on low dose norvasc.   ***   Past Medical History:  Diagnosis Date  . Anemia   . Anxiety   . CAD (coronary artery disease)    successful IVUs guided PCI of the lesion in the  proximal LAD using a promus drug-eluting stent with improvement in Fentanyl narrowing from 80% to 0%  . Depression   . Dyslipidemia   . GERD (gastroesophageal reflux disease)   . H/O: hysterectomy   . Hiatal hernia   . Hypertension   . Paroxysmal atrial fibrillation (HCC)   . Raynaud's disease   . Scleroderma (HCC)   . SUPRAVENTRICULAR TACHYCARDIA   . Supraventricular tachycardia [facility])     Past Surgical History:  Procedure Laterality Date  . BREAST ENHANCEMENT SURGERY    . right rotator cuff repair    . TOTAL ABDOMINAL HYSTERECTOMY       Current Outpatient Medications  Medication Sig Dispense Refill  . amLODipine (NORVASC) 5 MG tablet Take 1 tablet (5 mg total) by mouth daily. 30 tablet 6  . Cholecalciferol (VITAMIN D3) 2000 UNITS capsule 1 tablet once a day    . clopidogrel (PLAVIX) 75 MG tablet TAKE 1 TABLET (75 MG TOTAL) BY MOUTH DAILY. 90 tablet 0  . hydrochlorothiazide (HYDRODIURIL) 25 MG tablet TAKE ONE TABLET (25 MG DOSE) BY MOUTH DAILY.  1  . meloxicam (MOBIC) 7.5 MG tablet Take 7.5 mg by mouth as directed.     . nitroGLYCERIN (NITROSTAT) 0.4 MG SL tablet Place 1 tablet (0.4 mg total) under the tongue every 5 (five) minutes as needed. 25 tablet 3  . pantoprazole (PROTONIX) 20 MG tablet Take 20 mg  by mouth daily.    . rosuvastatin (CRESTOR) 5 MG tablet Take 1 tablet (5 mg total) by mouth daily. 90 tablet 3   No current facility-administered medications for this visit.     Allergies:   Patient has no known allergies.    Social History:  The patient  reports that she quit smoking about 57 years ago. Her smoking use included cigarettes. She has a 0.10 pack-year smoking history. She has never used smokeless tobacco. She reports that she drinks alcohol. She reports that she does not use drugs.   Family History:  The patient's family history includes Coronary artery disease in her other; Hyperlipidemia in her daughter; Kidney disease in her mother; Ovarian cancer in her maternal grandmother; Rheum arthritis in her maternal aunt.    ROS:  Please see the history of present illness.   Otherwise, review of systems are positive for none.   All other systems are reviewed and negative.    PHYSICAL EXAM: There were no vitals taken for this visit. Affect appropriate Healthy:  appears stated age HEENT: normal Neck supple with no adenopathy JVP normal no bruits no thyromegaly Lungs clear with no wheezing and good diaphragmatic motion Heart:  S1/S2 no murmur, no rub, gallop or click PMI normal Abdomen:  benighn, BS positve, no tenderness, no AAA no bruit.  No HSM or HJR Distal pulses intact with no bruits No edema Neuro non-focal Skin warm and dry No muscular weakness   EKG:  07/29/18 SR PVC poor R wave progression    Recent Labs: No results found for requested labs within last 8760 hours.    Lipid Panel    Component Value Date/Time   CHOL 131 04/11/2016 0935   TRIG 79 04/11/2016 0935   HDL 72 04/11/2016 0935   CHOLHDL 1.8 04/11/2016 0935   VLDL 16 04/11/2016 0935   LDLCALC 43 04/11/2016 0935      Wt Readings from Last 3 Encounters:  08/07/18 162 lb (73.5 kg)  07/29/18 162 lb 12.8 oz (73.8 kg)  06/06/17 162 lb 6.4 oz (73.7 kg)      Other studies Reviewed: Additional  studies/ records that were reviewed today include: None.    ASSESSMENT AND PLAN:  1. Pre-syncope:  Event monitor did not correlate with any arrhythmia with dizziness. Mostly NSR rates in 70's  Did have some PAT Auto triggered with no symptoms she prefers not to be on coreg/beta blocker due to low BP Seems improved with Norvasc   2. CAD s/p PCI/DES to pLAD in 2009: describes vague atypical chest pain.   Normal myovue 08/07/18 continue medical Rx    3. HTN: has been low now only on HCTZ and Norvasc   4. HLD: Crestor dose decreased to 5 mg due to myalgias labs with primary     No orders of the defined types were placed in this encounter.    Disposition:  F/U with cardiology in a year   Signed, Charlton Hawseter Lenis Nettleton, MD  09/20/2018 3:30 PM

## 2018-09-24 ENCOUNTER — Ambulatory Visit: Payer: Medicare HMO | Admitting: Cardiovascular Disease

## 2018-10-07 ENCOUNTER — Ambulatory Visit: Payer: Medicare HMO | Admitting: Cardiology

## 2018-10-07 ENCOUNTER — Encounter: Payer: Self-pay | Admitting: Cardiology

## 2018-10-07 VITALS — BP 124/72 | HR 59 | Ht 63.0 in | Wt 162.4 lb

## 2018-10-07 DIAGNOSIS — E785 Hyperlipidemia, unspecified: Secondary | ICD-10-CM

## 2018-10-07 DIAGNOSIS — R55 Syncope and collapse: Secondary | ICD-10-CM

## 2018-10-07 DIAGNOSIS — I471 Supraventricular tachycardia: Secondary | ICD-10-CM

## 2018-10-07 DIAGNOSIS — I2510 Atherosclerotic heart disease of native coronary artery without angina pectoris: Secondary | ICD-10-CM

## 2018-10-07 DIAGNOSIS — I10 Essential (primary) hypertension: Secondary | ICD-10-CM

## 2018-10-07 NOTE — Patient Instructions (Signed)
Medication Instructions:  Your physician recommends that you continue on your current medications as directed. Please refer to the Current Medication list given to you today.  * If you need a refill on your cardiac medications before your next appointment, please call your pharmacy.   Labwork: None ordered *We will only notify you of abnormal results, otherwise continue current treatment plan.  Testing/Procedures: None ordered  Follow-Up: None ordered  Thank you for choosing [facility]!!   Dory HornSherri Khloey Chern, RN [phone]

## 2018-10-07 NOTE — Progress Notes (Signed)
Electrophysiology Office Note   Date:  10/07/2018   ID:  [name], [name] 1941-01-10, MRN [id number]  PCP:  Tracey Harries, MD  Cardiologist:  Eden Emms Primary Electrophysiologist:  Kimoni Pagliarulo Jorja Loa, MD    No chief complaint on file.    History of Present Illness: Kathy George is a 77 y.o. female who is being seen today for the evaluation of presyncope at the request of Kathy George. Presenting today for electrophysiology evaluation.  He has a past history of coronary disease status post PCI with DES to the LAD 9, and attention, she has been having intermittent presyncopal episodes a few times a month.  They occur at varying times during the day.  She has to lay down immediately prior to passing out.  Symptoms last between 5 to 10 minutes.  On 07/25/2018 while making cookies more episodes lasted 1 hour.  Shortness of breath, or palpitations.  She has not had syncope.  She does have a history of labile blood pressures.  He had stopped her carvedilol and been started on Norvasc.  Since that time she is felt much improved without much in the way of syncope or presyncope.    Today, she denies symptoms of palpitations, chest pain, shortness of breath, orthopnea, PND, lower extremity edema, claudication, dizziness, presyncope, syncope, bleeding, or neurologic sequela. The patient is tolerating medications without difficulties.    Past Medical History:  Diagnosis Date  . Anemia   . Anxiety   . CAD (coronary artery disease)    successful IVUs guided PCI of the lesion in the  proximal LAD using a promus drug-eluting stent with improvement in Fentanyl narrowing from 80% to 0%  . Depression   . Dyslipidemia   . GERD (gastroesophageal reflux disease)   . H/O: hysterectomy   . Hiatal hernia   . Hypertension   . Paroxysmal atrial fibrillation (HCC)   . Raynaud's disease   . Scleroderma (HCC)   . SUPRAVENTRICULAR TACHYCARDIA   . Supraventricular tachycardia [facility])    Past Surgical  History:  Procedure Laterality Date  . BREAST ENHANCEMENT SURGERY    . right rotator cuff repair    . TOTAL ABDOMINAL HYSTERECTOMY       Current Outpatient Medications  Medication Sig Dispense Refill  . amLODipine (NORVASC) 5 MG tablet Take 1 tablet (5 mg total) by mouth daily. 30 tablet 6  . Cholecalciferol (VITAMIN D3) 2000 UNITS capsule 1 tablet once a day    . clopidogrel (PLAVIX) 75 MG tablet TAKE 1 TABLET (75 MG TOTAL) BY MOUTH DAILY. 90 tablet 0  . hydrochlorothiazide (HYDRODIURIL) 25 MG tablet TAKE ONE TABLET (25 MG DOSE) BY MOUTH DAILY.  1  . meloxicam (MOBIC) 7.5 MG tablet Take 7.5 mg by mouth daily as needed for pain.    . nitroGLYCERIN (NITROSTAT) 0.4 MG SL tablet Place 1 tablet (0.4 mg total) under the tongue every 5 (five) minutes as needed. 25 tablet 3  . pantoprazole (PROTONIX) 40 MG tablet Take 80 mg by mouth daily.  1  . rosuvastatin (CRESTOR) 5 MG tablet Take 2.5 mg by mouth daily.     No current facility-administered medications for this visit.     Allergies:   Patient has no known allergies.   Social History:  The patient  reports that she quit smoking about 57 years ago. Her smoking use included cigarettes. She has a 0.10 pack-year smoking history. She has never used smokeless tobacco. She reports that she drinks alcohol. She reports that she  does not use drugs.   Family History:  The patient's family history includes Coronary artery disease in her other; Hyperlipidemia in her daughter; Kidney disease in her mother; Ovarian cancer in her maternal grandmother; Rheum arthritis in her maternal aunt.    ROS:  Please see the history of present illness.   Otherwise, review of systems is positive for sweats, leg pain leg swelling, with back pain,.   All other systems are reviewed and negative.    PHYSICAL EXAM: VS:  BP 124/72   Pulse (!) 59   Ht 5\' 3"  (1.6 m)   Wt 162 lb 6.4 oz (73.7 kg)   SpO2 99%   BMI 28.77 kg/m  , BMI Body mass index is 28.77 kg/m. GEN:  Well nourished, well developed, in no acute distress  HEENT: normal  Neck: no JVD, carotid bruits, or masses Cardiac: RRR; no murmurs, rubs, or gallops,no edema  Respiratory:  clear to auscultation bilaterally, normal work of breathing GI: soft, nontender, nondistended, + BS MS: no deformity or atrophy  Skin: warm and dry Neuro:  Strength and sensation are intact Psych: euthymic mood, full affect  EKG:  EKG is not ordered today. Personal review of the ekg ordered 07/29/18 shows sinus rhythm, rate 60, PVC  Recent Labs: No results found for requested labs within last 8760 hours.    Lipid Panel     Component Value Date/Time   CHOL 131 04/11/2016 0935   TRIG 79 04/11/2016 0935   HDL 72 04/11/2016 0935   CHOLHDL 1.8 04/11/2016 0935   VLDL 16 04/11/2016 0935   LDLCALC 43 04/11/2016 0935     Wt Readings from Last 3 Encounters:  10/07/18 162 lb 6.4 oz (73.7 kg)  08/07/18 162 lb (73.5 kg)  07/29/18 162 lb 12.8 oz (73.8 kg)      Other studies Reviewed: Additional studies/ records that were reviewed today include: Myoview 08/07/18  Review of the above records today demonstrates:   The left ventricular ejection fraction is hyperdynamic (>65%).  Nuclear stress EF: 80%.  There was no ST segment deviation noted during stress.  The study is normal.  This is a low risk study.  Monitor 09/04/18 - personally reviewed NSR Most of her symptoms of palpitations and lightheadedness corresponded to NSR rates in 70's She did have multiple episodes of PAF vs PSVT but most of these were auto triggered with no symptoms   ASSESSMENT AND PLAN:  1.  Presyncope: Were cardiac monitor that showed no obvious causes of presyncope.  He did have some SVT on her monitor, but she was minimally symptomatic.  We Katalyna Socarras make no changes.  She does say that since stopping her carvedilol, she has not had any further episodes of near syncope.  2.  Coronary artery disease to the LAD: Currently on Plavix and  statin.  3.  Hypertension: Has been labile.  Plan per primary cardiology.  4.  Hyperlipidemia: Currently on Crestor.  5.  SVT: Episodes found on cardiac monitoring.  She was minimally symptomatic from this and does not wish further therapy.  This time.  Current medicines are reviewed at length with the patient today.   The patient does not have concerns regarding her medicines.  The following changes were made today:  none  Labs/ tests ordered today include:  No orders of the defined types were placed in this encounter.  Case discussed with referring Disposition:   FU with Jaicey Sweaney as needed months  Signed, Linetta Regner Jorja Loa, MD  10/07/2018 11:34 AM     [facility] [address] [place] [address] [phone] (office) [phone] (fax)

## 2018-10-21 ENCOUNTER — Ambulatory Visit: Payer: Medicare HMO | Admitting: Neurology

## 2018-10-21 ENCOUNTER — Encounter: Payer: Self-pay | Admitting: Neurology

## 2018-10-21 VITALS — BP 116/68 | HR 77 | Ht 63.5 in | Wt 160.0 lb

## 2018-10-21 DIAGNOSIS — I2510 Atherosclerotic heart disease of native coronary artery without angina pectoris: Secondary | ICD-10-CM | POA: Diagnosis not present

## 2018-10-21 DIAGNOSIS — I7300 Raynaud's syndrome without gangrene: Secondary | ICD-10-CM

## 2018-10-21 DIAGNOSIS — R42 Dizziness and giddiness: Secondary | ICD-10-CM | POA: Diagnosis not present

## 2018-10-21 DIAGNOSIS — R001 Bradycardia, unspecified: Secondary | ICD-10-CM

## 2018-10-21 DIAGNOSIS — I480 Paroxysmal atrial fibrillation: Secondary | ICD-10-CM | POA: Diagnosis not present

## 2018-10-21 DIAGNOSIS — M349 Systemic sclerosis, unspecified: Secondary | ICD-10-CM | POA: Diagnosis not present

## 2018-10-21 DIAGNOSIS — I951 Orthostatic hypotension: Secondary | ICD-10-CM

## 2018-10-21 NOTE — Progress Notes (Signed)
Provider:  Melvyn Novas, MD  Referring Provider: Tracey Harries, MD   Primary Care Physician:  Dr. Charlton Haws, MD Cardiology   Chief Complaint  Patient presents with  . New Patient (Initial Visit)    pt alone, rm 10. pt states a year or more she developed having light headed spell and it would pass. It was intermittent and happened randomly. Leading up to September 2019 she had these spells that were horrible. she states she laid head down, shaking, laid down for an hr. since then those bad ones she has been better..during the bad episode that she had she cant compare to being dizziness  she states that she was shaking and felt like she was going to faint.  . Other    pt states that with the small episodes she would be able to pass quckly but with the bad episodes she would shake and it took laying down for hr or more before the symptoms resolved. work up completed by cardiolgist were negative but they did make medication changes. pt states they didnt do orthrostatic pressure checks.     HPI:  Kathy George is a 77 y.o. female patient and seen here on 10-21-2018 upon referral from Dr. Eden Emms.  I have the pleasure of meeting Kathy George today, who has experienced multiple spells.  The spells are episodes that are not always happening while she is at rest or emotional, they are not related to the last time she ate food or drank fluid, and she has not found exterior triggers for these.  She had several spells in summer of last year when this whole symptom complex begun, and she felt after some of them excessively fatigued.  Some of them passed within seconds to a half a minute and others would require her to lay down and rest for an hour.  She had some trembling not twitching not involuntary movements and she did not lose awareness or consciousness.  There was no incontinence no tongue bite no injury no falls. She describes various situations such as feeling gas into her car and spell came on  and she had to lean onto her car until it passed.  She had a similar spell in her supermarket and had also to rest and hold onto something until the spell passed.  There was no long standing after-symptome, no postictum. She felt a crescendo in intensity by September 2019 , when she had 2 episodes back to back.   The patient has a family history of cerebral hemorrhages affecting her mother and maternal grandfather.  An aunt had heart disease and diabetes, there has been no family member affected by epilepsy.  Maternal grand mother had ovarian cancer.  Past medical history see below.   Social history, retired, mother of 3, 2 daughter and a son, grand and great grand children.  Social smoker , decades ago, ETOH , less than 3 a week, caffeine use- coffee in AM, one cup.  No Regular exercise= quit the gym 2 years ago due to back pain. .  Review of Systems: Out of a complete 14 system review, the patient complains of only the following symptoms, and all other reviewed systems are negative.  She denies vertigo, only has lightheadedness. No tinnitus , no hearing loss. No facial pain, no sinusitis.     Social History   Socioeconomic History  . Marital status: Divorced    Spouse name: Not on file  . Number of children: Y  .  Years of education: some colle  . Highest education level: Not on file  Occupational History  . Occupation: Chiropractorealtor     Employer: RETIRED    Comment: [employer]  . Occupation: travel agent  Social Needs  . Financial resource strain: Not on file  . Food insecurity:    Worry: Not on file    Inability: Not on file  . Transportation needs:    Medical: Not on file    Non-medical: Not on file  Tobacco Use  . Smoking status: Former Smoker    Packs/day: 0.10    Years: 1.00    Pack years: 0.10    Types: Cigarettes    Last attempt to quit: 11/04/1960    Years since quitting: 58.0  . Smokeless tobacco: Never Used  . Tobacco comment: only smoked socially in early 7420s    Substance and Sexual Activity  . Alcohol use: Yes    Alcohol/week: 2.0 - 3.0 standard drinks    Types: 2 - 3 Glasses of wine per week    Comment: socially  . Drug use: No  . Sexual activity: Never  Lifestyle  . Physical activity:    Days per week: Not on file    Minutes per session: Not on file  . Stress: Not on file  Relationships  . Social connections:    Talks on phone: Not on file    Gets together: Not on file    Attends religious service: Not on file    Active member of club or organization: Not on file    Attends meetings of clubs or organizations: Not on file    Relationship status: Not on file  . Intimate partner violence:    Fear of current or ex partner: Not on file    Emotionally abused: Not on file    Physically abused: Not on file    Forced sexual activity: Not on file  Other Topics Concern  . Not on file  Social History Narrative   Lives with her boyfriend, [name]    Family History  Problem Relation Age of Onset  . Kidney disease Mother        chronic; intracranial hemorrhage  . Coronary artery disease Other        grandfather  . Hyperlipidemia Daughter   . Rheum arthritis Maternal Aunt   . Ovarian cancer Maternal Grandmother     Past Medical History:  Diagnosis Date  . Anemia   . Anxiety   . CAD (coronary artery disease)    successful IVUs guided PCI of the lesion in the  proximal LAD using a promus drug-eluting stent with improvement in Fentanyl narrowing from 80% to 0%  . Depression   . Dyslipidemia   . GERD (gastroesophageal reflux disease)   . H/O: hysterectomy   . Hiatal hernia   . Hypertension   . Paroxysmal atrial fibrillation (HCC)   . Raynaud's disease   . Scleroderma (HCC)   . SUPRAVENTRICULAR TACHYCARDIA   . Supraventricular tachycardia [facility](HCC)     Past Surgical History:  Procedure Laterality Date  . BREAST ENHANCEMENT SURGERY    . right rotator cuff repair    . TOTAL ABDOMINAL HYSTERECTOMY      Allergies as of  10/21/2018  . (No Known Allergies)    Vitals: BP 116/68 (BP Location: Left Arm, Patient Position: Standing)   Pulse 77   Ht 5' 3.5" (1.613 m)   Wt 160 lb (72.6 kg)   BMI 27.90 kg/m  Last Weight:  Wt Readings from Last 1 Encounters:  10/21/18 160 lb (72.6 kg)   Last Height:   Ht Readings from Last 1 Encounters:  10/21/18 5' 3.5" (1.613 m)    Flowsheets     10/21/18 2:21 PM  10/21/18 2:22 PM  10/21/18 2:23 PM   BP  145/67  125/78  116/68   BP Location  LeftArm  LeftArm  LeftArm   Patient Position   Sitting  Standing   Pulse  72  70  77   Weight  160lb(72.6kg)     Height  5'3.5"(1.653m)         Orthostatic hypotension was confirmed -   This is defined as a decrease in systolic blood pressure of 20 mm Hg or a decrease in diastolic blood pressure of 10 mm Hg within three minutes of standing when compared with blood pressure from the sitting or supine position.   Physical exam:  General: The patient is awake, alert and appears not in acute distress. The patient is well groomed. Head: Normocephalic, atraumatic. Neck is supple.  neck circumference:13. 5 "  Cardiovascular:  Regular rate and rhythm  without  murmurs or carotid bruit, and without distended neck veins. Respiratory: Lungs are clear to auscultation.Skin:  Without evidence of edema, or rashTrunk: BMI is normal.  Neurologic exam : The patient is awake and alert, oriented to place and time.  Memory subjective described as intact. There is a normal attention span & concentration ability. Speech is fluent without dysarthria, dysphonia or aphasia. Mood and affect are appropriate.  Cranial nerves: Pupils are equal and briskly reactive to light. Visual acuity restricted. Funduscopic exam without evidence of pallor or edema. Extraocular movements  in vertical and horizontal planes with stuttering , non fluent movements.  Nystagmus.  Visual fields by finger perimetry are intact. Hearing to finger rub  intact.  Facial sensation intact to fine touch. Facial motor strength is symmetric and tongue and uvula move midline. Tongue protrusion into either cheek is normal. Shoulder shrug is normal.   Motor exam:  Muscle  Tone is elevated on the left, not right  , equal muscle bulk and symmetric  strength in all extremities. She has a droopier left shoulder ,   Sensory:  Fine touch, pinprick and vibration were normal. No Pronatordrift. Reynaud"s disease.   Coordination: Rapid alternating movements in the fingers/hands were normal. Finger-to-nose maneuver normal without evidence of ataxia, dysmetria or tremor.  Gait and station: Patient walks without assistive device and is able unassisted to climb up to the exam table. Strength within normal limits. Stance is stable and normal.  Tandem gait is unfragmented.  Deep tendon reflexes: in the  upper and lower extremities are symmetric and intact. Babinski maneuver response is down-going on the right .   Assessment:  After physical and neurologic examination, review of laboratory studies, imaging, neurophysiology testing and pre-existing records, assessment is that of :   Spells- Orthostatic hypotension was confirmed. May need compression stockings. But not all spells occurred after a postural change.   She reports mild lightheadedness, not ataxia, no balance problems. NOT VERTIGO.  Abnormal eye movements - pontine or frontal brain location based?  MRI Brain. See family history of amyloid vasculitis.   Plan:  Treatment plan and additional workup :Rv after these tests. Likely March 2020.     Porfirio Mylar Ottie Neglia MD 10/21/2018

## 2018-10-22 ENCOUNTER — Telehealth: Payer: Self-pay | Admitting: Neurology

## 2018-10-22 LAB — COMPREHENSIVE METABOLIC PANEL
A/G RATIO: 1.8 (ref 1.2–2.2)
ALK PHOS: 60 IU/L (ref 39–117)
ALT: 14 IU/L (ref 0–32)
AST: 22 IU/L (ref 0–40)
Albumin: 4.6 g/dL (ref 3.5–4.8)
BILIRUBIN TOTAL: 0.4 mg/dL (ref 0.0–1.2)
BUN/Creatinine Ratio: 15 (ref 12–28)
BUN: 18 mg/dL (ref 8–27)
CHLORIDE: 92 mmol/L — AB (ref 96–106)
CO2: 25 mmol/L (ref 20–29)
Calcium: 10.4 mg/dL — ABNORMAL HIGH (ref 8.7–10.3)
Creatinine, Ser: 1.17 mg/dL — ABNORMAL HIGH (ref 0.57–1.00)
GFR calc Af Amer: 51 mL/min/{1.73_m2} — ABNORMAL LOW (ref >59)
GFR calc non Af Amer: 44 mL/min/{1.73_m2} — ABNORMAL LOW (ref >59)
GLOBULIN, TOTAL: 2.6 g/dL (ref 1.5–4.5)
Glucose: 94 mg/dL (ref 65–99)
POTASSIUM: 3 mmol/L — AB (ref 3.5–5.2)
SODIUM: 138 mmol/L (ref 134–144)
Total Protein: 7.2 g/dL (ref 6.0–8.5)

## 2018-10-22 LAB — ANTI-SCLERODERMA ANTIBODY

## 2018-10-22 NOTE — Telephone Encounter (Signed)
[employer] medicare order sent to GI. They will obtain the auth and reach out to the pt to schedule.  °

## 2018-10-22 NOTE — Telephone Encounter (Signed)
Patient is aware I gave her GI phone number of [phone] and if she does not hear from them in the next 2-3 business days to give them a call.

## 2018-10-23 ENCOUNTER — Telehealth: Payer: Self-pay

## 2018-10-23 NOTE — Telephone Encounter (Signed)
-----   Message from Melvyn Novasarmen Dohmeier, MD sent at 10/22/2018  5:30 PM EST ----- GFR is reduced to 44 ml/ min. The Creatinine elevated to 1.17 mg/dl.  I like to do the MRI with contrast, still. Please ask her to hydrate well before and after to help reduce the contrast burden on her kidneys.   Darci Currentc Bouska, MD

## 2018-10-23 NOTE — Telephone Encounter (Signed)
I contacted the patient and advised of lab results. Also advised the day of MRI to hydrate with plenty of fluids.  Patient verbalized understanding and had no further questions.

## 2018-11-10 ENCOUNTER — Ambulatory Visit
Admission: RE | Admit: 2018-11-10 | Discharge: 2018-11-10 | Disposition: A | Discharge status: Home / Self Care | Payer: Medicare HMO | Source: Ambulatory Visit | Attending: Neurology | Admitting: Neurology

## 2018-11-10 DIAGNOSIS — I951 Orthostatic hypotension: Secondary | ICD-10-CM

## 2018-11-10 DIAGNOSIS — M349 Systemic sclerosis, unspecified: Secondary | ICD-10-CM

## 2018-11-10 DIAGNOSIS — I7300 Raynaud's syndrome without gangrene: Secondary | ICD-10-CM

## 2018-11-10 DIAGNOSIS — R42 Dizziness and giddiness: Secondary | ICD-10-CM

## 2018-11-10 DIAGNOSIS — I480 Paroxysmal atrial fibrillation: Secondary | ICD-10-CM

## 2018-11-10 DIAGNOSIS — I2510 Atherosclerotic heart disease of native coronary artery without angina pectoris: Secondary | ICD-10-CM

## 2018-11-10 MED ORDER — GADOBENATE DIMEGLUMINE 529 MG/ML IV SOLN
7.0000 mL | Freq: Once | INTRAVENOUS | Status: AC | PRN
  Administered 2018-11-10: 7 mL via INTRAVENOUS

## 2018-11-13 ENCOUNTER — Telehealth: Payer: Self-pay | Admitting: Neurology

## 2018-11-13 NOTE — Telephone Encounter (Signed)
-----   Message from Melvyn Novas, MD sent at 11/12/2018  4:45 PM EST ----- IMPRESSION:   MRI brain (with and without) demonstrating: - Scattered periventricular and subcortical chronic small vessel ischemic disease.  - No acute findings.   PS : Ethmoidal sinusitis was noted, can affect overall feeling of sinus pressure, may affect balance, vertigo.  I would recommend vestibular rehab.      INTERPRETING PHYSICIAN:  Suanne Marker, MD

## 2018-11-13 NOTE — Telephone Encounter (Signed)
Called the patient and reviewed the MRI results with her. Informed her of the findings and stated that Dr Vickey Huger would possibly recommend referral PT for vestibular rehab. Patient wanted to inform us that she woke up and her BP was systolic 80's. Advised the patient to continue monitoring her BP and advised her prescribing MD of her BP meds may need to be notified that her BP is getting that low. Pt would like to hold off for now for PT referral. She will call us back and advise [place] if she wants to move forward.

## 2019-01-14 ENCOUNTER — Other Ambulatory Visit: Payer: Self-pay

## 2019-01-14 MED ORDER — AMLODIPINE BESYLATE 5 MG PO TABS: 5.0000 mg | ORAL_TABLET | Freq: Every day | ORAL | 6 refills | Status: DC

## 2019-02-09 ENCOUNTER — Ambulatory Visit: Payer: Medicare HMO | Admitting: Neurology

## 2019-03-03 ENCOUNTER — Telehealth: Payer: Self-pay | Admitting: Neurology

## 2019-03-03 NOTE — Telephone Encounter (Signed)
I called patient to offer her a virtual visit for her 03/09/19 appointment due to Covid-19. Patient declined and stated that she does not feel that a follow-up is necessary because her PCP changed her medication. I cancelled appointment and advised patient to call back if needed.

## 2019-03-09 ENCOUNTER — Ambulatory Visit: Payer: Medicare HMO | Admitting: Neurology

## 2019-04-13 ENCOUNTER — Other Ambulatory Visit: Payer: Self-pay | Admitting: Family Medicine

## 2019-04-13 DIAGNOSIS — R5381 Other malaise: Secondary | ICD-10-CM

## 2019-04-21 ENCOUNTER — Other Ambulatory Visit: Payer: Self-pay | Admitting: Family Medicine

## 2019-04-21 DIAGNOSIS — E2839 Other primary ovarian failure: Secondary | ICD-10-CM

## 2019-07-02 ENCOUNTER — Ambulatory Visit
Admission: RE | Admit: 2019-07-02 | Discharge: 2019-07-02 | Disposition: A | Discharge status: Home / Self Care | Payer: Medicare HMO | Source: Ambulatory Visit | Attending: Family Medicine | Admitting: Family Medicine

## 2019-07-02 ENCOUNTER — Other Ambulatory Visit: Payer: Self-pay

## 2019-07-02 DIAGNOSIS — E2839 Other primary ovarian failure: Secondary | ICD-10-CM

## 2019-08-04 ENCOUNTER — Emergency Department (HOSPITAL_COMMUNITY): Payer: Medicare HMO

## 2019-08-04 ENCOUNTER — Emergency Department (HOSPITAL_COMMUNITY)
Admission: EM | Admit: 2019-08-04 | Discharge: 2019-08-04 | Disposition: A | Discharge status: Home / Self Care | Payer: Medicare HMO | Attending: Emergency Medicine | Admitting: Emergency Medicine

## 2019-08-04 ENCOUNTER — Other Ambulatory Visit: Payer: Self-pay

## 2019-08-04 ENCOUNTER — Encounter (HOSPITAL_COMMUNITY): Payer: Self-pay | Admitting: Family Medicine

## 2019-08-04 DIAGNOSIS — Z79899 Other long term (current) drug therapy: Secondary | ICD-10-CM | POA: Insufficient documentation

## 2019-08-04 DIAGNOSIS — I10 Essential (primary) hypertension: Secondary | ICD-10-CM | POA: Insufficient documentation

## 2019-08-04 DIAGNOSIS — Z7901 Long term (current) use of anticoagulants: Secondary | ICD-10-CM | POA: Diagnosis not present

## 2019-08-04 DIAGNOSIS — R42 Dizziness and giddiness: Secondary | ICD-10-CM | POA: Diagnosis not present

## 2019-08-04 DIAGNOSIS — Z8673 Personal history of transient ischemic attack (TIA), and cerebral infarction without residual deficits: Secondary | ICD-10-CM | POA: Insufficient documentation

## 2019-08-04 DIAGNOSIS — I2510 Atherosclerotic heart disease of native coronary artery without angina pectoris: Secondary | ICD-10-CM | POA: Insufficient documentation

## 2019-08-04 DIAGNOSIS — R0602 Shortness of breath: Secondary | ICD-10-CM | POA: Insufficient documentation

## 2019-08-04 LAB — URINALYSIS, ROUTINE W REFLEX MICROSCOPIC
Bilirubin Urine: NEGATIVE
Glucose, UA: NEGATIVE mg/dL
Hgb urine dipstick: NEGATIVE
Ketones, ur: NEGATIVE mg/dL
Nitrite: POSITIVE — AB
Protein, ur: NEGATIVE mg/dL
Specific Gravity, Urine: 1.009 (ref 1.005–1.030)
WBC, UA: >50 WBC/hpf — ABNORMAL HIGH (ref 0–5)
pH: 6 (ref 5.0–8.0)

## 2019-08-04 LAB — CBC WITH DIFFERENTIAL/PLATELET
Abs Immature Granulocytes: 0.03 10*3/uL (ref 0.00–0.07)
Basophils Absolute: 0 10*3/uL (ref 0.0–0.1)
Basophils Relative: 0 %
Eosinophils Absolute: 0 10*3/uL (ref 0.0–0.5)
Eosinophils Relative: 0 %
HCT: 37.1 % (ref 36.0–46.0)
Hemoglobin: 12.2 g/dL (ref 12.0–15.0)
Immature Granulocytes: 1 %
Lymphocytes Relative: 11 %
Lymphs Abs: 0.5 10*3/uL — ABNORMAL LOW (ref 0.7–4.0)
MCH: 28.6 pg (ref 26.0–34.0)
MCHC: 32.9 g/dL (ref 30.0–36.0)
MCV: 87.1 fL (ref 80.0–100.0)
Monocytes Absolute: 0.2 10*3/uL (ref 0.1–1.0)
Monocytes Relative: 4 %
Neutro Abs: 4 10*3/uL (ref 1.7–7.7)
Neutrophils Relative %: 84 %
Platelets: 198 10*3/uL (ref 150–400)
RBC: 4.26 MIL/uL (ref 3.87–5.11)
RDW: 15.2 % (ref 11.5–15.5)
WBC: 4.7 10*3/uL (ref 4.0–10.5)
nRBC: 0 % (ref 0.0–0.2)

## 2019-08-04 LAB — COMPREHENSIVE METABOLIC PANEL
ALT: 16 U/L (ref 0–44)
AST: 22 U/L (ref 15–41)
Albumin: 4.3 g/dL (ref 3.5–5.0)
Alkaline Phosphatase: 58 U/L (ref 38–126)
Anion gap: 11 (ref 5–15)
BUN: 17 mg/dL (ref 8–23)
CO2: 20 mmol/L — ABNORMAL LOW (ref 22–32)
Calcium: 9.3 mg/dL (ref 8.9–10.3)
Chloride: 101 mmol/L (ref 98–111)
Creatinine, Ser: 1.16 mg/dL — ABNORMAL HIGH (ref 0.44–1.00)
GFR calc Af Amer: 51 mL/min — ABNORMAL LOW (ref >60)
GFR calc non Af Amer: 44 mL/min — ABNORMAL LOW (ref >60)
Glucose, Bld: 128 mg/dL — ABNORMAL HIGH (ref 70–99)
Potassium: 3.4 mmol/L — ABNORMAL LOW (ref 3.5–5.1)
Sodium: 132 mmol/L — ABNORMAL LOW (ref 135–145)
Total Bilirubin: 0.4 mg/dL (ref 0.3–1.2)
Total Protein: 7.5 g/dL (ref 6.5–8.1)

## 2019-08-04 LAB — TROPONIN I (HIGH SENSITIVITY)
Troponin I (High Sensitivity): 4 ng/L (ref <18)
Troponin I (High Sensitivity): 4 ng/L (ref <18)

## 2019-08-04 LAB — LACTIC ACID, PLASMA: Lactic Acid, Venous: 1.9 mmol/L (ref 0.5–1.9)

## 2019-08-04 LAB — LIPASE, BLOOD: Lipase: 48 U/L (ref 11–51)

## 2019-08-04 MED ORDER — DIPHENHYDRAMINE HCL 50 MG/ML IJ SOLN
12.5000 mg | Freq: Once | INTRAMUSCULAR | Status: AC
  Administered 2019-08-04: 12.5 mg via INTRAVENOUS
  Filled 2019-08-04: qty 1

## 2019-08-04 MED ORDER — LORAZEPAM 2 MG/ML IJ SOLN
0.5000 mg | Freq: Once | INTRAMUSCULAR | Status: AC
  Administered 2019-08-04: 0.5 mg via INTRAVENOUS
  Filled 2019-08-04: qty 1

## 2019-08-04 MED ORDER — CIPROFLOXACIN HCL 500 MG PO TABS
500.0000 mg | ORAL_TABLET | Freq: Once | ORAL | Status: AC
  Administered 2019-08-04: 500 mg via ORAL
  Filled 2019-08-04: qty 1

## 2019-08-04 MED ORDER — PROCHLORPERAZINE EDISYLATE 10 MG/2ML IJ SOLN
5.0000 mg | Freq: Once | INTRAMUSCULAR | Status: AC
  Administered 2019-08-04: 08:00:00 5 mg via INTRAVENOUS
  Filled 2019-08-04: qty 2

## 2019-08-04 MED ORDER — IOHEXOL 350 MG/ML SOLN
80.0000 mL | Freq: Once | INTRAVENOUS | Status: AC | PRN
  Administered 2019-08-04: 09:00:00 80 mL via INTRAVENOUS

## 2019-08-04 MED ORDER — SODIUM CHLORIDE (PF) 0.9 % IJ SOLN
INTRAMUSCULAR | Status: AC
  Administered 2019-08-04: 09:00:00
  Filled 2019-08-04: qty 50

## 2019-08-04 MED ORDER — MECLIZINE HCL 25 MG PO TABS
25.0000 mg | ORAL_TABLET | Freq: Once | ORAL | Status: AC
  Administered 2019-08-04: 25 mg via ORAL
  Filled 2019-08-04: qty 1

## 2019-08-04 MED ORDER — SODIUM CHLORIDE 0.9 % IV BOLUS
500.0000 mL | Freq: Once | INTRAVENOUS | Status: AC
  Administered 2019-08-04: 500 mL via INTRAVENOUS

## 2019-08-04 MED ORDER — CIPROFLOXACIN HCL 500 MG PO TABS: 500.0000 mg | ORAL_TABLET | Freq: Two times a day (BID) | ORAL | 0 refills | Status: DC

## 2019-08-04 NOTE — ED Notes (Signed)
Pt reports increased dizziness.

## 2019-08-04 NOTE — ED Notes (Signed)
Patient transported to MRI 

## 2019-08-04 NOTE — ED Notes (Signed)
Pt ambulated successfully with 1 person assist. Pt reported some dizziness.

## 2019-08-04 NOTE — ED Notes (Signed)
Pt states that her vertigo has gotten worse than when Dr Tyrone Nine was in room, despite meds.

## 2019-08-04 NOTE — ED Provider Notes (Signed)
[facility]-EMERGENCY DEPT Provider Note   CSN: [id number] Arrival date & time: 08/04/19  0544     History   Chief Complaint Chief Complaint  Patient presents with  . Shortness of Breath    HPI Kathy George is a 78 y.o. female.     Patient presents to the emergency department for evaluation of shortness of breath.  Patient reports that she awakened around 3 AM and felt very short of breath.  She noticed that she was diaphoretic but was not experiencing any chest pain.  She was unable to go back to sleep.  Around 4 AM she turned over in bed and then suddenly felt severe dizziness.  She does not feel like she or the room is spinning, but any movement of her head causes her to feel severe dizziness and this has caused nausea and vomiting.  She feels like she is very shaky.     Past Medical History:  Diagnosis Date  . Anemia   . Anxiety   . CAD (coronary artery disease)    successful IVUs guided PCI of the lesion in the  proximal LAD using a promus drug-eluting stent with improvement in Fentanyl narrowing from 80% to 0%  . Depression   . Dyslipidemia   . GERD (gastroesophageal reflux disease)   . H/O: hysterectomy   . Hiatal hernia   . Hypertension   . Paroxysmal atrial fibrillation (HCC)   . Raynaud's disease   . Scleroderma (HCC)   . SUPRAVENTRICULAR TACHYCARDIA   . Supraventricular tachycardia [facility](HCC)     Patient Active Problem List   Diagnosis Date Noted  . Bradycardia 12/13/2014  . TIA (transient ischemic attack) 05/27/2014  . Skin lesion 10/13/2013  . Cough 01/17/2012  . Scleroderma (HCC) 01/17/2012  . Edema 08/05/2011  . THYROID NODULE, RIGHT 07/03/2009  . Elevated lipids 12/31/2007  . ANEMIA 12/31/2007  . ANXIETY 12/31/2007  . DEPRESSION 12/31/2007  . Essential hypertension 12/31/2007  . Coronary atherosclerosis 12/31/2007  . ATRIAL FIBRILLATION, PAROXYSMAL 12/31/2007  . SUPRAVENTRICULAR TACHYCARDIA 12/31/2007  . RAYNAUD'S DISEASE  12/31/2007  . GERD 12/31/2007  . HIATAL HERNIA 12/31/2007    Past Surgical History:  Procedure Laterality Date  . BREAST ENHANCEMENT SURGERY    . right rotator cuff repair    . TOTAL ABDOMINAL HYSTERECTOMY       OB History   No obstetric history on file.      Home Medications    Prior to Admission medications   Medication Sig Start Date End Date Taking? Authorizing Provider  amLODipine (NORVASC) 5 MG tablet Take 1 tablet (5 mg total) by mouth daily. 01/14/19 04/14/19  Wendall StadeNishan, Peter C, MD  Cholecalciferol (VITAMIN D3) 2000 UNITS capsule 1 tablet once a day 09/11/11   [provider]  clopidogrel (PLAVIX) 75 MG tablet TAKE 1 TABLET (75 MG TOTAL) BY MOUTH DAILY. 12/07/15   Wendall StadeNishan, Peter C, MD  hydrochlorothiazide (HYDRODIURIL) 25 MG tablet TAKE ONE TABLET (25 MG DOSE) BY MOUTH DAILY. 07/07/18   [provider]  meloxicam (MOBIC) 7.5 MG tablet Take 7.5 mg by mouth daily as needed for pain.    [provider]  nitroGLYCERIN (NITROSTAT) 0.4 MG SL tablet Place 1 tablet (0.4 mg total) under the tongue every 5 (five) minutes as needed. 07/29/18   Kroeger, Ovidio KinKrista M., PA-C  pantoprazole (PROTONIX) 40 MG tablet Take 80 mg by mouth daily. 08/15/18   [provider]  rosuvastatin (CRESTOR) 5 MG tablet Take 2.5 mg by mouth  daily.    [provider]    Family History Family History  Problem Relation Age of Onset  . Kidney disease Mother        chronic; intracranial hemorrhage  . Coronary artery disease Other        grandfather  . Hyperlipidemia Daughter   . Rheum arthritis Maternal Aunt   . Ovarian cancer Maternal Grandmother     Social History Social History   Tobacco Use  . Smoking status: Former Smoker    Packs/day: 0.10    Years: 1.00    Pack years: 0.10    Types: Cigarettes    Quit date: 11/04/1960    Years since quitting: 58.7  . Smokeless tobacco: Never Used  . Tobacco comment: only smoked socially in early 20s  Substance Use Topics   . Alcohol use: Yes    Alcohol/week: 2.0 - 3.0 standard drinks    Types: 2 - 3 Glasses of wine per week    Comment: socially  . Drug use: No     Allergies   Patient has no known allergies.   Review of Systems Review of Systems  Constitutional: Negative for fever.  Respiratory: Positive for shortness of breath. Negative for cough.   Cardiovascular: Negative for chest pain.  Gastrointestinal: Positive for nausea and vomiting.  Neurological: Positive for dizziness.  All other systems reviewed and are negative.    Physical Exam Updated Vital Signs BP (!) 143/70 (BP Location: Right Arm)   Pulse 94   Temp 97.9 F (36.6 C) (Oral)   Resp 20   Ht 5\' 3"  (1.6 m)   Wt 70.3 kg   SpO2 100%   BMI 27.46 kg/m   Physical Exam Vitals signs and nursing note reviewed.  Constitutional:      General: She is not in acute distress.    Appearance: Normal appearance. She is well-developed.  HENT:     Head: Normocephalic and atraumatic.     Right Ear: Hearing normal.     Left Ear: Hearing normal.     Nose: Nose normal.  Eyes:     Conjunctiva/sclera: Conjunctivae normal.     Pupils: Pupils are equal, round, and reactive to light.  Neck:     Musculoskeletal: Normal range of motion and neck supple.  Cardiovascular:     Rate and Rhythm: Regular rhythm.     Heart sounds: S1 normal and S2 normal. No murmur. No friction rub. No gallop.   Pulmonary:     Effort: Pulmonary effort is normal. No respiratory distress.     Breath sounds: Normal breath sounds.  Chest:     Chest wall: No tenderness.  Abdominal:     General: Bowel sounds are normal.     Palpations: Abdomen is soft.     Tenderness: There is no abdominal tenderness. There is no guarding or rebound. Negative signs include Murphy's sign and McBurney's sign.     Hernia: No hernia is present.  Musculoskeletal: Normal range of motion.  Skin:    General: Skin is warm and dry.     Findings: No rash.  Neurological:     Mental Status:  She is alert and oriented to person, place, and time.     GCS: GCS eye subscore is 4. GCS verbal subscore is 5. GCS motor subscore is 6.     Cranial Nerves: No cranial nerve deficit.     Sensory: No sensory deficit.     Coordination: Coordination normal.     Comments:  Resting tremor of upper extremities  Equal strength and sensation in all 4 extremities  Psychiatric:        Speech: Speech normal.        Behavior: Behavior normal.        Thought Content: Thought content normal.      ED Treatments / Results  Labs (all labs ordered are listed, but only abnormal results are displayed) Labs Reviewed - No data to display  EKG EKG Interpretation  Date/Time:  Wednesday August 04 2019 05:55:29 EDT Ventricular Rate:  94 PR Interval:    QRS Duration: 92 QT Interval:  373 QTC Calculation: 467 R Axis:   -61 Text Interpretation:  Sinus rhythm Probable left atrial enlargement Left anterior fascicular block Borderline low voltage, extremity leads Borderline repolarization abnormality No significant change since last tracing Confirmed by Orpah Greek [phone]) on 08/04/2019 6:42:06 AM   Radiology Dg Chest 2 View  Result Date: 08/04/2019 CLINICAL DATA:  Shortness of breath EXAM: CHEST - 2 VIEW COMPARISON:  01/17/2012 FINDINGS: Normal heart size and mediastinal contours. A coronary stent is noted. There is no edema, consolidation, effusion, or pneumothorax. Increased density over the heart in the lateral view is stable from prior and attributed to cardiac incisura. No opacity seen on the frontal view. Breast implants with calcification. IMPRESSION: Stable from prior.  No evidence of active disease. Electronically Signed   By: Monte Fantasia M.D.   On: 08/04/2019 06:52    Procedures Procedures (including critical care time)  Medications Ordered in ED Medications - No data to display   Initial Impression / Assessment and Plan / ED Course  I have reviewed the triage vital signs  and the nursing notes.  Pertinent labs & imaging results that were available during my care of the patient were reviewed by me and considered in my medical decision making (see chart for details).        Patient presents to the emergency department for evaluation of shortness of breath.  Shortness of breath was of unclear etiology.  She does not have any significant wheezing.  There is not any significant rales or sign of congestive heart failure.  Chest x-ray is clear.  Will require work-up for the shortness of breath.  Patient also experiencing acute onset dizziness after rolling over in bed with associated nausea and vomiting.  She is very symptomatic with movement of her head.  This is consistent with a vertigo.  Will order CT, IV fluids and follow.  Will sign out to oncoming ER physician to follow results and reevaluate/disposition patient.  Final Clinical Impressions(s) / ED Diagnoses   Final diagnoses:  SOB (shortness of breath)  Vertigo    ED Discharge Orders    None       , Gwenyth Allegra, MD 08/04/19 [phone]

## 2019-08-04 NOTE — ED Provider Notes (Signed)
78 yo F I received in signout from Dr. Levora Angel.  Briefly the patient is a 78 year old female that woke up about 4 hours ago with sudden onset shortness of breath diaphoresis and nausea.  Lasted for about an hour or so and then resolved.  The patient then had a separate moment where she rolled over and felt very dizzy.  Every small head movement would make her feel very dizzy and sick to her stomach.  Both of these events have somewhat improved.  She has had a new tremor going on for the past 4 days or so.  Patient has a history of a left-sided stroke.  Has no known residual weakness per her.  She has had a stent placed for extreme fatigue in the past.  She denies any shortness of breath or chest pressure or pain with that.  Plan for lab work chest x-ray CT of the head.  My exam the patient has left sided tremor >R, no appreciable weakness, no nystagmus.   \ I discussed the case with Dr. Leonel Ramsay, neurology.  Recommended performing an MRI.  MRI is negative for acute infarct.  Patient does have persistent urinary tract infection.  She is feeling better and is able to ambulate without difficulty.  I offered inpatient versus outpatient treatment and the patient is electing to be treated as an outpatient.  We will switch to a fluoroquinolone.  Have her follow-up with urology for a recurrent urinary tract infection going on for the past 7 months.  PCP follow-up as well.  1:01 PM:  I have discussed the diagnosis/risks/treatment options with the patient and family and believe the pt to be eligible for discharge home to follow-up with PCP, urology. We also discussed returning to the ED immediately if new or worsening sx occur. We discussed the sx which are most concerning (e.g., sudden worsening pain, fever, inability to tolerate by mouth) that necessitate immediate return. Medications administered to the patient during their visit and any new prescriptions provided to the patient are listed below.  Medications  given during this visit Medications  ciprofloxacin (CIPRO) tablet 500 mg (has no administration in time range)  sodium chloride 0.9 % bolus 500 mL ( Intravenous Stopped 08/04/19 0812)  meclizine (ANTIVERT) tablet 25 mg (25 mg Oral Given 08/04/19 0748)  prochlorperazine (COMPAZINE) injection 5 mg (5 mg Intravenous Given 08/04/19 0748)  diphenhydrAMINE (BENADRYL) injection 12.5 mg (12.5 mg Intravenous Given 08/04/19 0748)  iohexol (OMNIPAQUE) 350 MG/ML injection 80 mL (80 mLs Intravenous Contrast Given 08/04/19 0845)  sodium chloride (PF) 0.9 % injection (  Given by Other 08/04/19 0907)  LORazepam (ATIVAN) injection 0.5 mg (0.5 mg Intravenous Given 08/04/19 1058)     The patient appears reasonably screen and/or stabilized for discharge and I doubt any other medical condition or other [facility] requiring further screening, evaluation, or treatment in the ED at this time prior to discharge.     Deno Etienne, DO 08/04/19 1301

## 2019-08-04 NOTE — ED Notes (Signed)
Pt is claustrophobic. MD made aware and orders received for Ativan for MRI

## 2019-08-04 NOTE — ED Notes (Signed)
Patient transported to CT 

## 2019-08-04 NOTE — Discharge Instructions (Addendum)
Follow up with your family doctor and urology.  Please return for fever or inability to walk.  Take your medication as prescribed.  Please stop your current antibiotic and switch to the one that I have prescribed.

## 2019-08-04 NOTE — ED Triage Notes (Signed)
Patient is complaining of shortness of breath, nausea, vomiting x 1, dizziness, and lightheaded. Symptoms woke her up at 3:00am. Denies chest pain.

## 2019-08-06 LAB — URINE CULTURE: Culture: >=100000 — AB

## 2019-08-07 ENCOUNTER — Telehealth: Payer: Self-pay | Admitting: Emergency Medicine

## 2019-08-07 NOTE — Telephone Encounter (Signed)
Post ED Visit - Positive Culture Follow-up  Culture report reviewed by antimicrobial stewardship pharmacist: [place] Team []  Elenor Quinones, Pharm.D. []  Heide Guile, Pharm.D., BCPS AQ-ID []  Parks Neptune, Pharm.D., BCPS []  Alycia Rossetti, Pharm.D., BCPS []  [place], [place].D., BCPS, AAHIVP []  Legrand Como, Pharm.D., BCPS, AAHIVP []  Salome Arnt, PharmD, BCPS []  Johnnette Gourd, PharmD, BCPS []  Hughes Better, PharmD, BCPS []  Leeroy Cha, PharmD []  Laqueta Linden, PharmD, BCPS []  Albertina Parr, PharmD  [place] Team []  Leodis Sias, PharmD []  Lindell Spar, PharmD []  Royetta Asal, PharmD []  Graylin Shiver, Rph []  Rema Fendt) Glennon Mac, PharmD []  Arlyn Dunning, PharmD []  Netta Cedars, PharmD [x]  Dia Sitter, PharmD []  Leone Haven, PharmD []  Gretta Arab, PharmD []  Theodis Shove, PharmD []  Peggyann Juba, PharmD []  Reuel Boom, PharmD   Positive urine culture Treated with Ciprofloxacin, organism sensitive to the same and no further patient follow-up is required at this time.  Sandi Raveling Richards Pherigo 08/07/2019, 12:43 PM

## 2019-08-09 ENCOUNTER — Ambulatory Visit: Payer: Medicare HMO | Admitting: Gynecology

## 2019-09-02 ENCOUNTER — Other Ambulatory Visit: Payer: Self-pay

## 2019-09-03 ENCOUNTER — Ambulatory Visit: Payer: Medicare HMO | Admitting: Obstetrics & Gynecology

## 2019-09-03 ENCOUNTER — Encounter: Payer: Self-pay | Admitting: Obstetrics & Gynecology

## 2019-09-03 VITALS — BP 122/78 | Ht 63.0 in | Wt 158.0 lb

## 2019-09-03 DIAGNOSIS — N393 Stress incontinence (female) (male): Secondary | ICD-10-CM | POA: Diagnosis not present

## 2019-09-03 DIAGNOSIS — N3090 Cystitis, unspecified without hematuria: Secondary | ICD-10-CM | POA: Diagnosis not present

## 2019-09-03 NOTE — Progress Notes (Signed)
    Kathy George 01-22-1941 [id number]        78 y.o.  G3P3L3  RP: Urinary incontinence  HPI: S/P TAH at age 78 for Menorrhagia.  BSO later on.  Recent frequent Acute Cystitis, E. Coli per U. Culture 08/04/2019.  Seen by Urology, started on Keflex 250 mg daily at bedtime.  C/O mild SUI with sneezing, coughing or when heavy lifting.  No urinary urgency incontinence.  No vaginal bulging or pain.   OB History  No obstetric history on file.    Past medical history,surgical history, problem list, medications, allergies, family history and social history were all reviewed and documented in the EPIC chart.   Directed ROS with pertinent positives and negatives documented in the history of present illness/assessment and plan.  Exam:  Vitals:   09/03/19 1145  BP: 122/78  Weight: 158 lb (71.7 kg)  Height: 5\' 3"  (1.6 m)   General appearance:  Normal  Abdomen: Normal  Gynecologic exam: Vulva normal.  Bimanual exam:  Uterus/cervix absent.  No pelvic mass felt, NT.  No significant colpocele/cystocele/rectocele with valsalva in laying down and standing position.   Assessment/Plan:  78 y.o. No obstetric history on file.   1. SUI (stress urinary incontinence, female) Mild stress urinary incontinence without cystocele.  No surgical intervention indicated at this time.  Recommend Kegel exercises.  May refer for physical therapy as needed in the future.  Recommend to avoid heavy lifting and to empty the bladder regularly.  2. Recurrent cystitis Seen by Urology.  Well on Keflex 250 mg daily HS prophylaxis.  Other orders - cephALEXin (KEFLEX) 250 MG capsule; Take 250 mg by mouth at bedtime.  Counseling on above issues and coordination of care more than 50% for 30 minutes.  Princess Bruins MD, 12:10 PM 09/03/2019

## 2019-09-06 ENCOUNTER — Encounter: Payer: Self-pay | Admitting: Obstetrics & Gynecology

## 2019-09-06 NOTE — Patient Instructions (Signed)
1. SUI (stress urinary incontinence, female) Mild stress urinary incontinence without cystocele.  No surgical intervention indicated at this time.  Recommend Kegel exercises.  May refer for physical therapy as needed in the future.  Recommend to avoid heavy lifting and to empty the bladder regularly.  2. Recurrent cystitis Seen by Urology.  Well on Keflex 250 mg daily HS prophylaxis.  Other orders - cephALEXin (KEFLEX) 250 MG capsule; Take 250 mg by mouth at bedtime.  Arieon, it was a pleasure seeing you today!

## 2019-09-20 ENCOUNTER — Ambulatory Visit: Payer: Medicare HMO | Admitting: Neurology

## 2020-04-13 ENCOUNTER — Encounter: Payer: Self-pay | Admitting: General Practice

## 2020-06-02 NOTE — Progress Notes (Signed)
06/14/2020 Kathy George   04-05-1941  [id number]  Primary Physician Tracey Harries, MD Primary Cardiologist: Eden Emms EP:  Elberta Fortis  Reason for Visit/CC: Routine f/u for CAD Pre syncope   HPI:  79 y.o.  female who presents to clinic for routine f/u for CAD I have note seen her since 2018. More recently seen by PA and Dr Elberta Fortis . In 11/2007 she underwent stenting of her proximal LAD with a DES. EF has been normal by echo She had some pre syncope while on coreg in 2019 with monitor showing some self limited SVT and seen by Dr Elberta Fortis. Symptoms improved off beta blocker and no further w/u done   Last myovue done 08/07/18 non ischemic EF 80%   She does not smoke. She drinks 1-2 cups of coffee in the mornings. No excessive sodium in her diet. She drinks socially, occasionally having a glass of wine or 2.  She is more sedentary and not walking   Has a grandson that is brilliant in math and is looking at graduate school in [place] with girlfriend  Has some chronic cystitis and upper back pain Sees [facility] for primary care Has had vaccine [place] daughter has not and is in Cat[place] this week    Current Outpatient Medications  Medication Sig Dispense Refill  . cephALEXin (KEFLEX) 250 MG capsule Take 250 mg by mouth at bedtime.    . Cholecalciferol (VITAMIN D3) 2000 UNITS capsule Take 2,000 Units by mouth daily. 1 tablet once a day    . clopidogrel (PLAVIX) 75 MG tablet TAKE 1 TABLET (75 MG TOTAL) BY MOUTH DAILY. (Patient taking differently: Take 75 mg by mouth daily. ) 90 tablet 0  . nitroGLYCERIN (NITROSTAT) 0.4 MG SL tablet Place 1 tablet (0.4 mg total) under the tongue every 5 (five) minutes as needed. 25 tablet 3  . pantoprazole (PROTONIX) 40 MG tablet Take 80 mg by mouth daily.  1  . rosuvastatin (CRESTOR) 10 MG tablet Take 10 mg by mouth daily.    Marland Kitchen amLODipine (NORVASC) 5 MG tablet Take 1 tablet (5 mg total) by mouth daily. 30 tablet 6   No current facility-administered medications for this  visit.    No Known Allergies  Social History   Socioeconomic History  . Marital status: Divorced    Spouse name: Not on file  . Number of children: Y  . Years of education: some colle  . Highest education level: Not on file  Occupational History  . Occupation: Chiropractor: RETIRED    Comment: [employer]  . Occupation: travel agent  Tobacco Use  . Smoking status: Former Smoker    Packs/day: 0.10    Years: 1.00    Pack years: 0.10    Types: Cigarettes    Quit date: 11/04/1960    Years since quitting: 59.6  . Smokeless tobacco: Never Used  . Tobacco comment: only smoked socially in early 81s  Substance and Sexual Activity  . Alcohol use: Yes    Alcohol/week: 2.0 - 3.0 standard drinks    Types: 2 - 3 Glasses of wine per week    Comment: socially  . Drug use: No  . Sexual activity: Never  Other Topics Concern  . Not on file  Social History Narrative   Lives with her boyfriend, Marijean Niemann   Social Determinants of Health   Financial Resource Strain:   . Difficulty of Paying Living Expenses:   Food Insecurity:   . Worried About Running  Out of Food in the Last Year:   . Ran Out of Food in the Last Year:   Transportation Needs:   . Lack of Transportation (Medical):   Marland Kitchen Lack of Transportation (Non-Medical):   Physical Activity:   . Days of Exercise per Week:   . Minutes of Exercise per Session:   Stress:   . Feeling of Stress :   Social Connections:   . Frequency of Communication with Friends and Family:   . Frequency of Social Gatherings with Friends and Family:   . Attends Religious Services:   . Active Member of Clubs or Organizations:   . Attends Banker Meetings:   Marland Kitchen Marital Status:   Intimate Partner Violence:   . Fear of Current or Ex-Partner:   . Emotionally Abused:   Marland Kitchen Physically Abused:   . Sexually Abused:      Review of Systems: General: negative for chills, fever, night sweats or weight changes.  Cardiovascular:  negative for chest pain, dyspnea on exertion, edema, orthopnea, palpitations, paroxysmal nocturnal dyspnea or shortness of breath Dermatological: negative for rash Respiratory: negative for cough or wheezing Urologic: negative for hematuria Abdominal: negative for nausea, vomiting, diarrhea, bright red blood per rectum, melena, or hematemesis Neurologic: negative for visual changes, syncope, or dizziness All other systems reviewed and are otherwise negative except as noted above.    Blood pressure (!) 142/78, pulse 63, height 5\' 3"  (1.6 m), weight 157 lb (71.2 kg), SpO2 99 %.  Affect appropriate Healthy:  appears stated age HEENT: normal Neck supple with no adenopathy JVP normal no bruits no thyromegaly Lungs clear with no wheezing and good diaphragmatic motion Heart:  S1/S2 no murmur, no rub, gallop or click PMI normal Abdomen: benighn, BS positve, no tenderness, no AAA no bruit.  No HSM or HJR Distal pulses intact with no bruits No edema Neuro non-focal Skin warm and dry No muscular weakness   EKG Sinus bradycardia 51 bpm low voltage and flat ST segments   ASSESSMENT AND PLAN:   1. CAD: s/p LAD stenting 2009 . She denies any chest pain. No exertional symptoms. EKG w/o signs of ischemia. Continue medical therapy. Normal myovue without ischemia 08/07/18   2. HTN :Labile  No beta blocker see above   3. HLD: on Crestor and Zetia  Followed by PCP, Dr. 10/07/18.    4. Pre Syncope:  Related to coreg monitor with self limited SVT Seen by EP Dr Mardelle Matte observation only   5. Cystitis:  Chronic seeing [facility] with [facility] for cystoscopy latter this month Continue Keflex   PLAN F/u with me in a year    Elberta Fortis

## 2020-06-14 ENCOUNTER — Encounter: Payer: Self-pay | Admitting: Cardiovascular Disease

## 2020-06-14 ENCOUNTER — Other Ambulatory Visit: Payer: Self-pay

## 2020-06-14 ENCOUNTER — Ambulatory Visit: Payer: Medicare HMO | Admitting: Cardiovascular Disease

## 2020-06-14 VITALS — BP 142/78 | HR 63 | Ht 63.0 in | Wt 157.0 lb

## 2020-06-14 DIAGNOSIS — I2510 Atherosclerotic heart disease of native coronary artery without angina pectoris: Secondary | ICD-10-CM

## 2020-06-14 MED ORDER — NITROGLYCERIN 0.4 MG SL SUBL: 0.4000 mg | SUBLINGUAL_TABLET | SUBLINGUAL | 3 refills | Status: AC | PRN

## 2020-06-14 NOTE — Patient Instructions (Signed)

## 2020-09-28 IMAGING — MR MR HEAD W/O CM
8 of 10 series · 37 of 48 positions shown · non-contrast
Comparison: MRI head 11/10/2018

CLINICAL DATA: Focal neuro deficit greater than 6 hours. Nausea
vomiting dizziness lightheaded

EXAM:
MRI HEAD WITHOUT CONTRAST
TECHNIQUE: Multiplanar, multiecho pulse sequences of the brain and surrounding
structures were obtained without intravenous contrast.

[Series 3: DWI · axial · 3.0mm · 1.09mm/px · z∈[-53,+94]mm · 9 of 100 slices shown (1 of 4)]
[im 1/100]
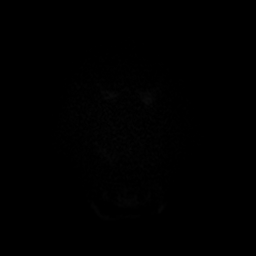
[im 13/100]
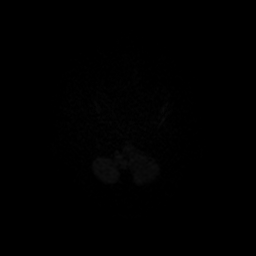
[im 25/100]
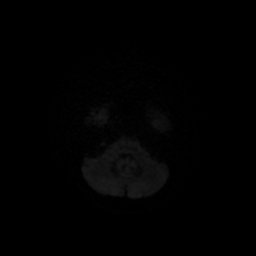
[im 38/100]
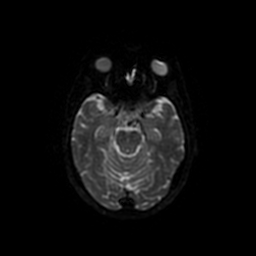
[im 50/100]
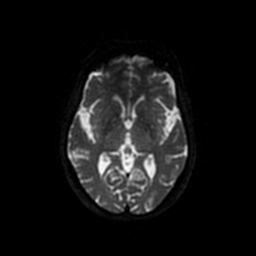
[im 62/100]
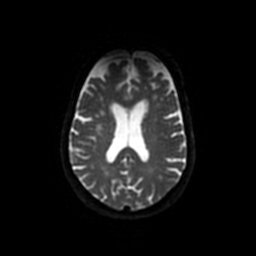
[im 75/100]
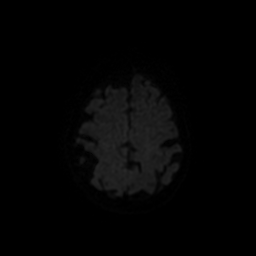
[im 87/100]
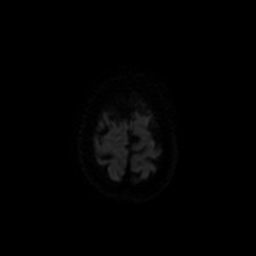
[im 100/100]
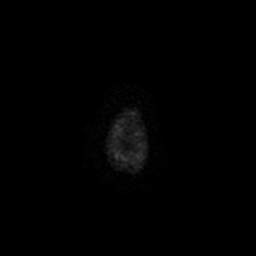

[Series 4: T1 · sagittal · 5.0mm · 0.47mm/px · 2 of 22 slices shown]
[im 1/22]
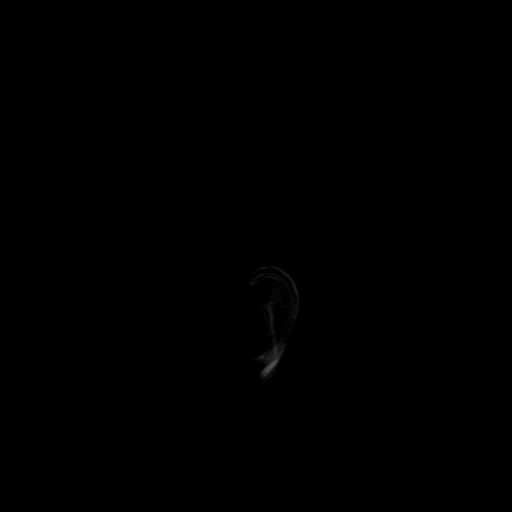
[im 22/22]
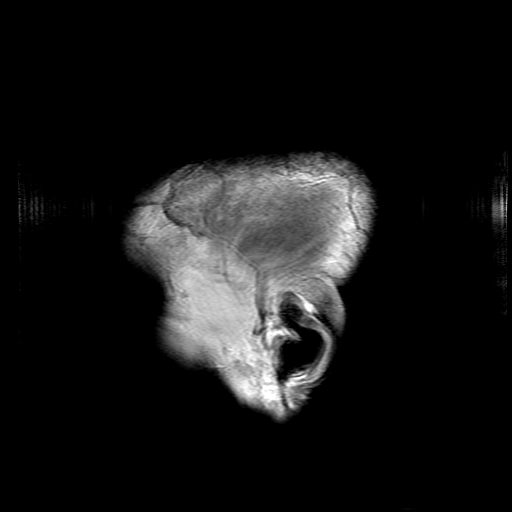

[Series 5: T2 · axial · 5.0mm · 0.43mm/px · z∈[-70,+92]mm · 3 of 28 slices shown (1 of 2)]
[im 1/28]
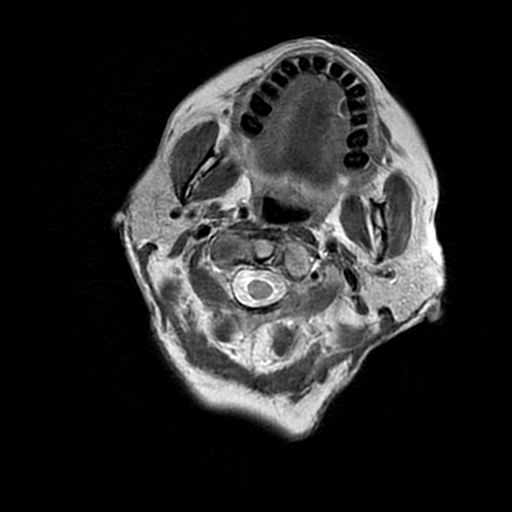
[im 14/28]
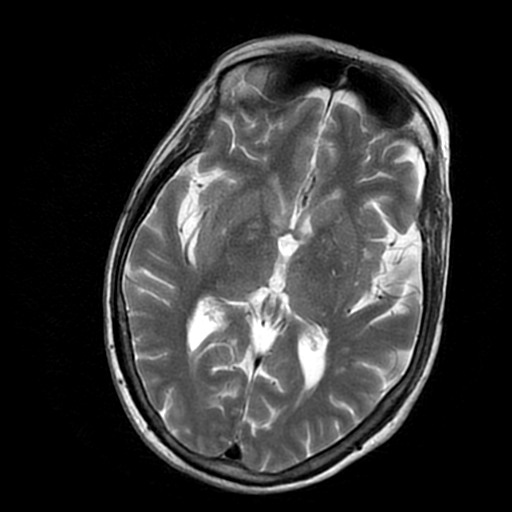
[im 28/28]
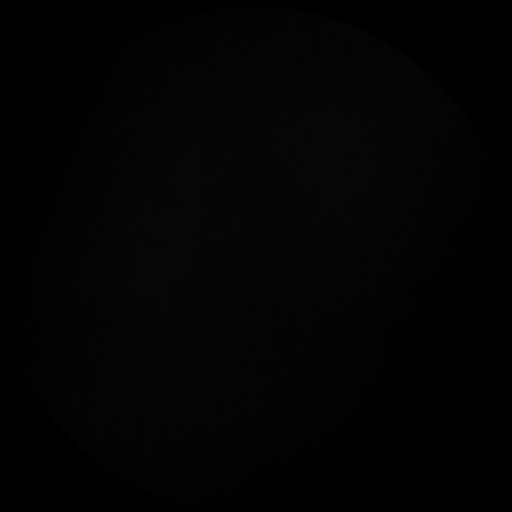

[Series 6: FLAIR · axial · 3.0mm · 0.43mm/px · z∈[-70,+92]mm · 3 of 28 slices shown]
[im 1/28]
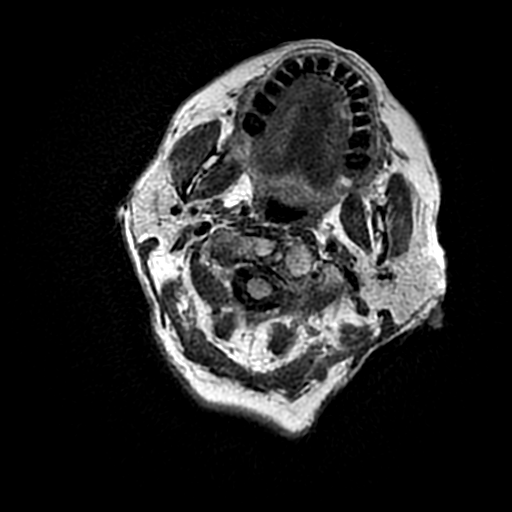
[im 14/28]
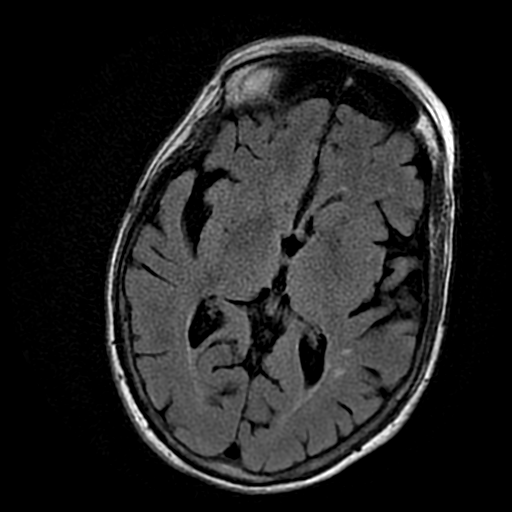
[im 28/28]
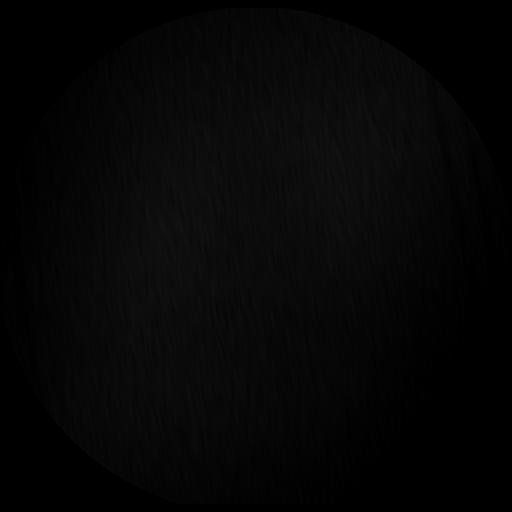

[Series 9: DWI · coronal · 4.0mm · 1.09mm/px · 8 of 84 slices shown (2 of 4)]
[im 1/84]
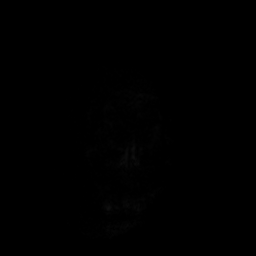
[im 12/84]
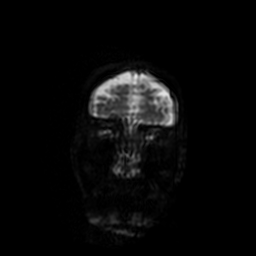
[im 24/84]
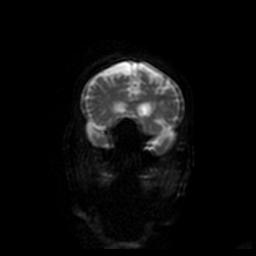
[im 36/84]
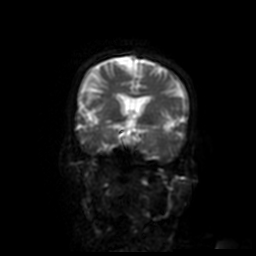
[im 48/84]
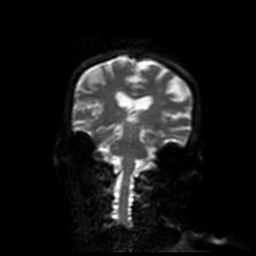
[im 60/84]
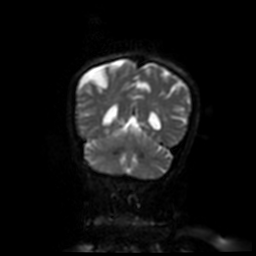
[im 72/84]
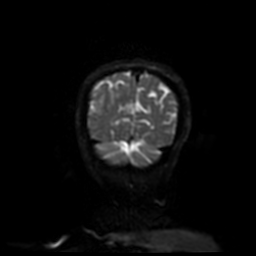
[im 84/84]
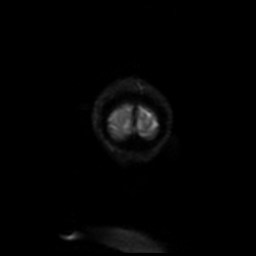

[Series 10: T2 · coronal · 5.0mm · 0.45mm/px · 3 of 29 slices shown (2 of 2)]
[im 1/29]
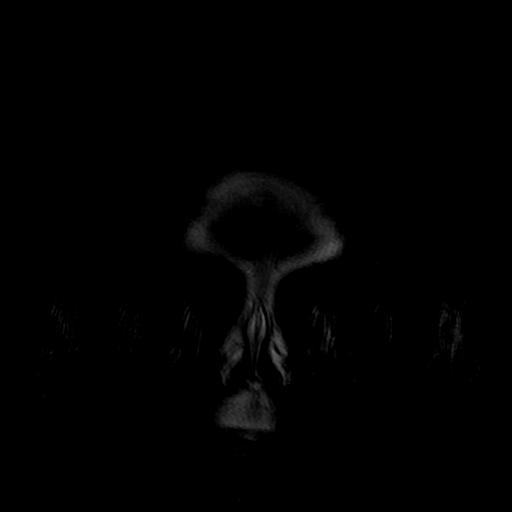
[im 15/29]
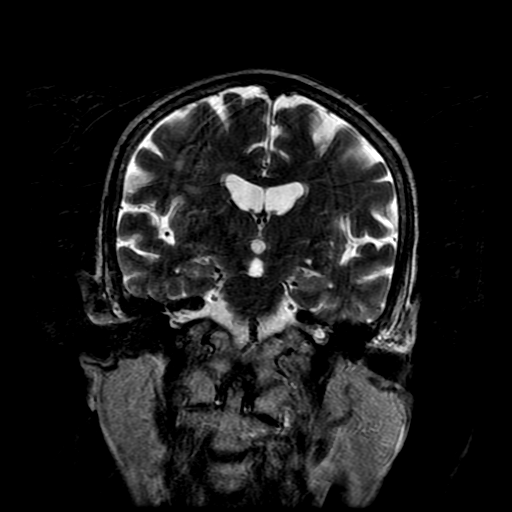
[im 29/29]
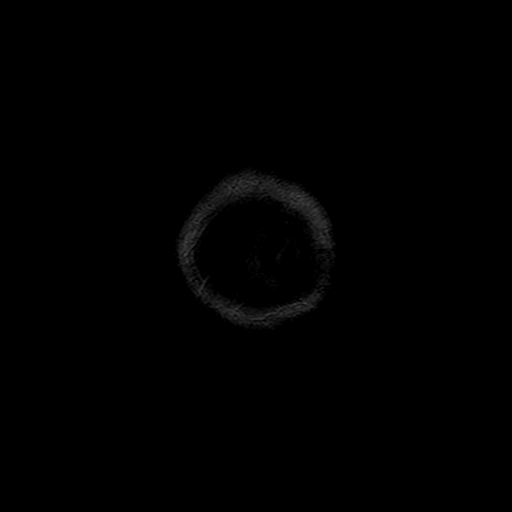

[Series 300: DWI · axial · 3.0mm · 1.09mm/px · z∈[-53,+94]mm · 5 of 50 slices shown (3 of 4)]
[im 1/50]
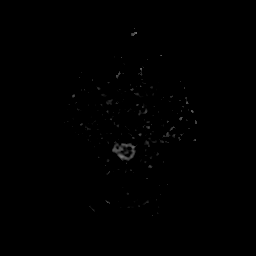
[im 13/50]
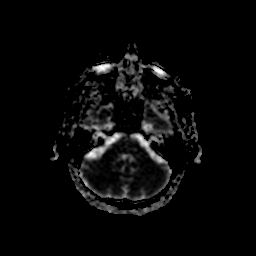
[im 25/50]
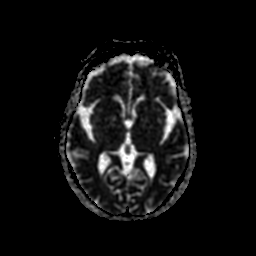
[im 37/50]
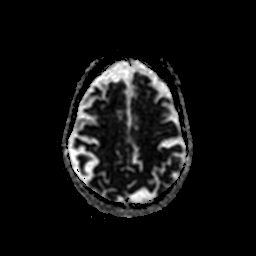
[im 50/50]
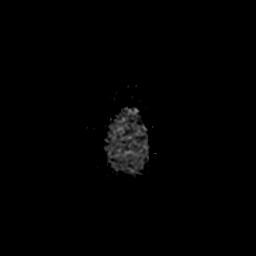

[Series 900: DWI · coronal · 4.0mm · 1.09mm/px · 4 of 42 slices shown (4 of 4)]
[im 1/42]
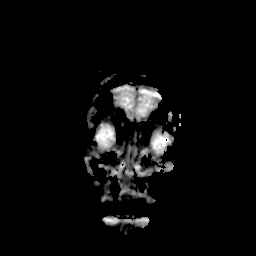
[im 14/42]
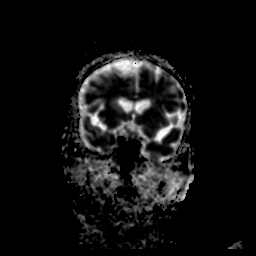
[im 28/42]
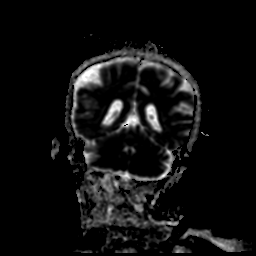
[im 42/42]
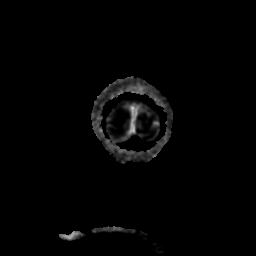

[37 of 48 positions shown; findings below may reference images not displayed]

FINDINGS: Brain: Negative for acute infarct

Generalized atrophy. Multiple small white matter hyperintensities
bilaterally similar to the prior study. Brainstem and cerebellum
normal. Negative for hemorrhage or mass

Motion degraded study.

Vascular: Normal arterial flow voids

Skull and upper cervical spine: Negative

Sinuses/Orbits: Mild mucosal edema paranasal sinuses. Bilateral
cataract surgery

Other: None
IMPRESSION: No acute infarct. Mild to moderate chronic microvascular ischemic
changes in the white matter, stable from the prior study.

## 2022-02-22 ENCOUNTER — Telehealth: Payer: Self-pay | Admitting: Cardiovascular Disease

## 2022-02-22 NOTE — Telephone Encounter (Signed)
Pt states last night she had an episode of CP around 8pm while sitting in bed and reading.  Was on the left side under the breast.  Happened again one other time during the night and then had slight pain this morning.  Currently not having any CP.  Took Nitro with the 2 episodes from last night and had relief.  BP was 146/80s, HR 60s-70s.  Denies SOB, N/V, diaphoresis, lightheadedness, dizziness or other issues.  Spoke with Dr. Eden Emms and he said to have pt continue to monitor.  Has a hx of CP.  Spoke with pt and made her aware.  Pt verbalized understanding and was in agreement with plan.  ?

## 2022-02-22 NOTE — Telephone Encounter (Signed)
Pt c/o of Chest Pain: STAT if CP now or developed within 24 hours ? ?1. Are you having CP right now? No ? ?2. Are you experiencing any other symptoms (ex. SOB, nausea, vomiting, sweating)? No ? ?3. How long have you been experiencing CP? Last night ? ?4. Is your CP continuous or coming and going? Comes and goes ? ?5. Have you taken Nitroglycerin? 2 - Last night ? ?Pt states that the CP were about 4hrs apart last night. Had CP this morning as well but pt states they weren't as bad as last night ?? ? ? ?

## 2022-02-26 ENCOUNTER — Other Ambulatory Visit: Payer: Self-pay | Admitting: Cardiovascular Disease

## 2022-02-26 ENCOUNTER — Other Ambulatory Visit (HOSPITAL_COMMUNITY): Payer: Self-pay | Admitting: Cardiovascular Disease

## 2022-02-26 DIAGNOSIS — R079 Chest pain, unspecified: Secondary | ICD-10-CM

## 2022-02-26 NOTE — Progress Notes (Incomplete)
? ?02/26/2022 ?Kathy George   ?1941/09/09  ?[id number] ? ?Primary Physician Tracey Harries, MD ?Primary Cardiologist: Eden Emms ?EP:  [place] ? ?Reason for Visit/CC: Routine f/u for CAD Pre syncope  ? ?HPI:  81 y.o.  female who presents to clinic for routine f/u for CAD  ?More recently seen by PA and Dr Elberta Fortis . In 11/2007 she underwent stenting of her proximal LAD with a DES. EF has been normal by echo She had some pre syncope while on coreg in 2019 with monitor showing some self limited SVT and seen by Dr Elberta Fortis. Symptoms improved off beta blocker and no further w/u done  ? ?Last myovue done 08/07/18 non ischemic EF 80%  ? ?She does not smoke. She drinks 1-2 cups of coffee in the mornings. No excessive sodium in her diet. She drinks socially, occasionally having a glass of wine or 2.  She is more sedentary and not walking  ? ?Has a grandson that is brilliant in math and is looking at graduate school in [place] with girlfriend ? ?Has some chronic cystitis and upper back pain Sees [facility] for primary care ? ?Called office having 2 days of atypical resting pain at night Had two episodes 4 hours apart 02/21/22 and then again am of 02/22/22 Left sided under breast Did have some relief with nitro during evening episodes  ? ? ?*** ? ?Current Outpatient Medications  ?Medication Sig Dispense Refill  ? amLODipine (NORVASC) 5 MG tablet Take 1 tablet (5 mg total) by mouth daily. 30 tablet 6  ? cephALEXin (KEFLEX) 250 MG capsule Take 250 mg by mouth at bedtime.    ? Cholecalciferol (VITAMIN D3) 2000 UNITS capsule Take 2,000 Units by mouth daily. 1 tablet once a day    ? clopidogrel (PLAVIX) 75 MG tablet TAKE 1 TABLET (75 MG TOTAL) BY MOUTH DAILY. (Patient taking differently: Take 75 mg by mouth daily. ) 90 tablet 0  ? nitroGLYCERIN (NITROSTAT) 0.4 MG SL tablet Place 1 tablet (0.4 mg total) under the tongue every 5 (five) minutes as needed. 25 tablet 3  ? pantoprazole (PROTONIX) 40 MG tablet Take 80 mg by mouth daily.  1  ? rosuvastatin  (CRESTOR) 10 MG tablet Take 10 mg by mouth daily.    ? ?No current facility-administered medications for this visit.  ? ? ?No Known Allergies ? ?Social History  ? ?Socioeconomic History  ? Marital status: Divorced  ?  Spouse name: Not on file  ? Number of children: Y  ? Years of education: some colle  ? Highest education level: Not on file  ?Occupational History  ? Occupation: Realtor   ?  Employer: RETIRED  ?  Comment: [employer]  ? Occupation: travel agent  ?Tobacco Use  ? Smoking status: Former  ?  Packs/day: 0.10  ?  Years: 1.00  ?  Pack years: 0.10  ?  Types: Cigarettes  ?  Quit date: 11/04/1960  ?  Years since quitting: 61.3  ? Smokeless tobacco: Never  ? Tobacco comments:  ?  only smoked socially in early 80s  ?Substance and Sexual Activity  ? Alcohol use: Yes  ?  Alcohol/week: 2.0 - 3.0 standard drinks  ?  Types: 2 - 3 Glasses of wine per week  ?  Comment: socially  ? Drug use: No  ? Sexual activity: Never  ?Other Topics Concern  ? Not on file  ?Social History Narrative  ? Lives with her boyfriend, Marijean Niemann  ? ?Social Determinants of Health  ? ?  Financial Resource Strain: Not on file  ?Food Insecurity: Not on file  ?Transportation Needs: Not on file  ?Physical Activity: Not on file  ?Stress: Not on file  ?Social Connections: Not on file  ?Intimate Partner Violence: Not on file  ?  ? ?Review of Systems: ?General: negative for chills, fever, night sweats or weight changes.  ?Cardiovascular: negative for chest pain, dyspnea on exertion, edema, orthopnea, palpitations, paroxysmal nocturnal dyspnea or shortness of breath ?Dermatological: negative for rash ?Respiratory: negative for cough or wheezing ?Urologic: negative for hematuria ?Abdominal: negative for nausea, vomiting, diarrhea, bright red blood per rectum, melena, or hematemesis ?Neurologic: negative for visual changes, syncope, or dizziness ?All other systems reviewed and are otherwise negative except as noted above. ? ? ? ?There were no vitals  taken for this visit.  ?Affect appropriate ?Healthy:  appears stated age ?HEENT: normal ?Neck supple with no adenopathy ?JVP normal no bruits no thyromegaly ?Lungs clear with no wheezing and good diaphragmatic motion ?Heart:  S1/S2 no murmur, no rub, gallop or click ?PMI normal ?Abdomen: benighn, BS positve, no tenderness, no AAA ?no bruit.  No HSM or HJR ?Distal pulses intact with no bruits ?No edema ?Neuro non-focal ?Skin warm and dry ?No muscular weakness ? ? ?EKG Sinus bradycardia 51 bpm low voltage and flat ST segments  ? ?ASSESSMENT AND PLAN:  ? ?1. CAD: s/p LAD stenting 2009 . Normal myovue without ischemia 08/07/18 Recurrent chest pain somewhat atypical at rest but some relief with nitro shared decision making favor PET/CT scan  ? ?2. HTN :Labile  No beta blocker see above  ? ?3. HLD: on Crestor and Zetia  Followed by PCP, Dr. Mardelle Matte.   ? ?4. Pre Syncope:  Related to coreg monitor with self limited SVT Seen by EP Dr Elberta Fortis observation only  ? ?5. Cystitis:  Chronic seeing [facility] for cystoscopy latter this month Continue Keflex ? ? ?*** ? ? ?PLAN F/u with me in a year  ? ? ?Charlton Haws ?

## 2022-02-27 ENCOUNTER — Telehealth: Payer: Self-pay

## 2022-02-27 NOTE — Telephone Encounter (Signed)
Left message for patient to call back  

## 2022-02-27 NOTE — Telephone Encounter (Signed)
-----   Message from Wendall Stade, MD sent at 02/26/2022  1:38 PM EDT ----- ?See if we can get a cardiac PET/CT done before she sees me 03/08/22 She has recurrent chest pain with history of LAD stent  ? ?

## 2022-03-04 NOTE — Telephone Encounter (Signed)
Patient returning call. She states she also would like to speak with Pam about whether she should reschedule her appointment with Dr. Johnsie Cancel for after her stress test.  ?

## 2022-03-04 NOTE — Telephone Encounter (Signed)
Rescheduled patient for 04/04/22 for office visit after test complete. ?

## 2022-03-07 ENCOUNTER — Ambulatory Visit: Payer: Medicare HMO | Admitting: Cardiovascular Disease

## 2022-03-25 ENCOUNTER — Telehealth (HOSPITAL_COMMUNITY): Payer: Self-pay | Admitting: Emergency Medicine

## 2022-03-25 NOTE — Telephone Encounter (Signed)
Reaching out to patient to offer assistance regarding upcoming cardiac imaging study; pt verbalizes understanding of appt date/time, parking situation and where to check in, pre-test NPO status and medications ordered, and verified current allergies; name and call back number provided for further questions should they arise Gaje Tennyson RN Navigator Cardiac Imaging [facility] [phone] office [phone] cell 

## 2022-03-26 ENCOUNTER — Encounter (HOSPITAL_COMMUNITY): Payer: Self-pay

## 2022-03-26 ENCOUNTER — Encounter (HOSPITAL_COMMUNITY)
Admission: RE | Admit: 2022-03-26 | Discharge: 2022-03-26 | Disposition: A | Discharge status: Home / Self Care | Payer: Medicare HMO | Source: Ambulatory Visit | Attending: Cardiovascular Disease | Admitting: Cardiovascular Disease

## 2022-03-26 DIAGNOSIS — R079 Chest pain, unspecified: Secondary | ICD-10-CM | POA: Insufficient documentation

## 2022-03-26 MED ORDER — REGADENOSON 0.4 MG/5ML IV SOLN
INTRAVENOUS | Status: AC
  Filled 2022-03-26: qty 5

## 2022-03-27 NOTE — Progress Notes (Signed)
04/04/2022 Kathy George   27-Mar-1941  [id number]  Primary Physician Tracey Harries, MD Primary Cardiologist: Eden Emms EP:  Elberta Fortis  Reason for Visit/CC: Routine f/u for CAD Pre syncope   HPI:  81 y.o.  female who presents to clinic for routine f/u for CAD  More recently seen by PA and Dr Elberta Fortis . In 11/2007 she underwent stenting of her proximal LAD with a DES. EF has been normal by echo She had some pre syncope while on coreg in 2019 with monitor showing some self limited SVT and seen by Dr Elberta Fortis. Symptoms improved off beta blocker and no further w/u done   Last myovue done 08/07/18 non ischemic EF 80%   She does not smoke. She drinks 1-2 cups of coffee in the mornings. No excessive sodium in her diet. She drinks socially, occasionally having a glass of wine or 2.  She is more sedentary and not walking   Has a grandson that is brilliant in math and is looking at graduate school in [place] with girlfriend  Has some chronic cystitis and upper back pain Sees [facility] for primary care  Called office having 2 days of atypical resting pain at night Had two episodes 4 hours apart 02/21/22 and then again am of 02/22/22 Left sided under breast Did have some relief with nitro during evening episodes   PET/CT done 5/31 normal with increase in EF with stress, normal blood flow reserve and normal perfusion   Current Outpatient Medications  Medication Sig Dispense Refill   amLODipine (NORVASC) 5 MG tablet Take 1 tablet (5 mg total) by mouth daily. 30 tablet 6   cephALEXin (KEFLEX) 250 MG capsule Take 250 mg by mouth at bedtime.     Cholecalciferol (VITAMIN D3) 2000 UNITS capsule Take 2,000 Units by mouth daily. 1 tablet once a day     clopidogrel (PLAVIX) 75 MG tablet TAKE 1 TABLET (75 MG TOTAL) BY MOUTH DAILY. (Patient taking differently: Take 75 mg by mouth daily.) 90 tablet 0   nitroGLYCERIN (NITROSTAT) 0.4 MG SL tablet Place 1 tablet (0.4 mg total) under the tongue every 5 (five) minutes as needed. 25  tablet 3   pantoprazole (PROTONIX) 40 MG tablet Take 80 mg by mouth daily.  1   rosuvastatin (CRESTOR) 10 MG tablet Take 10 mg by mouth daily.     No current facility-administered medications for this visit.    No Known Allergies  Social History   Socioeconomic History   Marital status: Divorced    Spouse name: Not on file   Number of children: Y   Years of education: some colle   Highest education level: Not on file  Occupational History   Occupation: Chiropractor: RETIRED    Comment: [employer]   Occupation: travel agent  Tobacco Use   Smoking status: Former    Packs/day: 0.10    Years: 1.00    Pack years: 0.10    Types: Cigarettes    Quit date: 11/04/1960    Years since quitting: 61.4   Smokeless tobacco: Never   Tobacco comments:    only smoked socially in early 11s  Substance and Sexual Activity   Alcohol use: Yes    Alcohol/week: 2.0 - 3.0 standard drinks    Types: 2 - 3 Glasses of wine per week    Comment: socially   Drug use: No   Sexual activity: Never  Other Topics Concern   Not on file  Social History Narrative  Lives with her boyfriend, Marijean Niemann   Social Determinants of Health   Financial Resource Strain: Not on file  Food Insecurity: Not on file  Transportation Needs: Not on file  Physical Activity: Not on file  Stress: Not on file  Social Connections: Not on file  Intimate Partner Violence: Not on file     Review of Systems: General: negative for chills, fever, night sweats or weight changes.  Cardiovascular: negative for chest pain, dyspnea on exertion, edema, orthopnea, palpitations, paroxysmal nocturnal dyspnea or shortness of breath Dermatological: negative for rash Respiratory: negative for cough or wheezing Urologic: negative for hematuria Abdominal: negative for nausea, vomiting, diarrhea, bright red blood per rectum, melena, or hematemesis Neurologic: negative for visual changes, syncope, or dizziness All other  systems reviewed and are otherwise negative except as noted above.    Blood pressure 138/64, pulse 65, height 5\' 3"  (1.6 m), weight 139 lb (63 kg), SpO2 97 %.  Affect appropriate Healthy:  appears stated age HEENT: normal Neck supple with no adenopathy JVP normal no bruits no thyromegaly Lungs clear with no wheezing and good diaphragmatic motion Heart:  S1/S2 AV sclerosis  murmur, no rub, gallop or click PMI normal Abdomen: benighn, BS positve, no tenderness, no AAA no bruit.  No HSM or HJR Distal pulses intact with no bruits No edema Neuro non-focal Skin warm and dry No muscular weakness   EKG Sinus bradycardia 51 bpm low voltage and flat ST segments   ASSESSMENT AND PLAN:   1. CAD: s/p LAD stenting 2009 . Normal myovue without ischemia 08/07/18 Recurrent chest pain somewhat atypical at rest but some relief with nitro PET CT scan done 04/03/22 normal including resting/stress EF and blood flow   2. HTN :Labile  No beta blocker see above   3. HLD: on Crestor and Zetia  Followed by PCP, Dr. 04/05/22.    4. Pre Syncope:  Related to coreg monitor with self limited SVT Seen by EP Dr Mardelle Matte observation only   5. Cystitis:  Chronic seeing [facility] for cystoscopy latter this month Continue Keflex   F/U in a year    PLAN F/u with me in a year    Elberta Fortis

## 2022-03-29 ENCOUNTER — Telehealth (HOSPITAL_COMMUNITY): Payer: Self-pay | Admitting: Emergency Medicine

## 2022-03-29 NOTE — Telephone Encounter (Signed)
Reaching out to patient to offer assistance regarding upcoming cardiac imaging study; pt verbalizes understanding of appt date/time, parking situation and where to check in, pre-test NPO status and medications ordered, and verified current allergies; name and call back number provided for further questions should they arise Ifeoluwa Beller RN Navigator Cardiac Imaging [facility] [phone] office [phone] cell 

## 2022-04-02 ENCOUNTER — Encounter (HOSPITAL_COMMUNITY)
Admission: RE | Admit: 2022-04-02 | Discharge: 2022-04-02 | Disposition: A | Discharge status: Home / Self Care | Payer: Medicare HMO | Source: Ambulatory Visit | Attending: Cardiovascular Disease | Admitting: Cardiovascular Disease

## 2022-04-02 DIAGNOSIS — R079 Chest pain, unspecified: Secondary | ICD-10-CM | POA: Diagnosis present

## 2022-04-02 MED ORDER — RUBIDIUM RB82 GENERATOR (RUBYFILL)
18.3400 | PACK | Freq: Once | INTRAVENOUS | Status: AC
  Administered 2022-04-02: 18.34 via INTRAVENOUS

## 2022-04-02 MED ORDER — RUBIDIUM RB82 GENERATOR (RUBYFILL)
18.6800 | PACK | Freq: Once | INTRAVENOUS | Status: AC
  Administered 2022-04-02: 18.68 via INTRAVENOUS

## 2022-04-02 MED ORDER — REGADENOSON 0.4 MG/5ML IV SOLN
0.4000 mg | Freq: Once | INTRAVENOUS | Status: AC
  Administered 2022-04-02: 0.4 mg via INTRAVENOUS

## 2022-04-03 LAB — NM PET CT CARDIAC PERFUSION MULTI W/ABSOLUTE BLOODFLOW
MBFR: 2.53
Nuc Rest EF: 70 %
Nuc Stress EF: 72 %
Peak HR: 117 {beats}/min
Rest HR: 65 {beats}/min
Rest MBF: 1.37 ml/g/min
Rest Nuclear Isotope Dose: 18.7 mCi
Rest perfusion cavity size (mL): 53 mL
ST Depression (mm): 0 mm
Stress MBF: 3.46 ml/g/min
Stress Nuclear Isotope Dose: 18.3 mCi
Stress perfusion cavity size (mL): 58 mL
TID: 1.08

## 2022-04-04 ENCOUNTER — Ambulatory Visit: Payer: Medicare HMO | Admitting: Cardiovascular Disease

## 2022-04-04 ENCOUNTER — Encounter: Payer: Self-pay | Admitting: Cardiovascular Disease

## 2022-04-04 VITALS — BP 138/64 | HR 65 | Ht 63.0 in | Wt 139.0 lb

## 2022-04-04 DIAGNOSIS — R079 Chest pain, unspecified: Secondary | ICD-10-CM

## 2022-04-04 DIAGNOSIS — I10 Essential (primary) hypertension: Secondary | ICD-10-CM | POA: Diagnosis not present

## 2022-04-04 DIAGNOSIS — I2510 Atherosclerotic heart disease of native coronary artery without angina pectoris: Secondary | ICD-10-CM

## 2022-04-04 DIAGNOSIS — E782 Mixed hyperlipidemia: Secondary | ICD-10-CM | POA: Diagnosis not present

## 2022-04-04 NOTE — Patient Instructions (Signed)

## 2022-08-21 ENCOUNTER — Ambulatory Visit: Payer: Medicare HMO | Admitting: Podiatry

## 2022-09-19 ENCOUNTER — Emergency Department (HOSPITAL_BASED_OUTPATIENT_CLINIC_OR_DEPARTMENT_OTHER)
Admission: EM | Admit: 2022-09-19 | Discharge: 2022-09-20 | Disposition: A | Discharge status: Home / Self Care | Payer: Medicare HMO | Attending: Emergency Medicine | Admitting: Emergency Medicine

## 2022-09-19 ENCOUNTER — Other Ambulatory Visit: Payer: Self-pay

## 2022-09-19 ENCOUNTER — Encounter (HOSPITAL_BASED_OUTPATIENT_CLINIC_OR_DEPARTMENT_OTHER): Payer: Self-pay

## 2022-09-19 ENCOUNTER — Emergency Department (HOSPITAL_BASED_OUTPATIENT_CLINIC_OR_DEPARTMENT_OTHER): Payer: Medicare HMO | Admitting: Radiology

## 2022-09-19 DIAGNOSIS — I2510 Atherosclerotic heart disease of native coronary artery without angina pectoris: Secondary | ICD-10-CM | POA: Diagnosis not present

## 2022-09-19 DIAGNOSIS — E860 Dehydration: Secondary | ICD-10-CM | POA: Insufficient documentation

## 2022-09-19 DIAGNOSIS — U071 COVID-19: Secondary | ICD-10-CM

## 2022-09-19 DIAGNOSIS — Z7902 Long term (current) use of antithrombotics/antiplatelets: Secondary | ICD-10-CM | POA: Insufficient documentation

## 2022-09-19 DIAGNOSIS — I10 Essential (primary) hypertension: Secondary | ICD-10-CM | POA: Diagnosis not present

## 2022-09-19 DIAGNOSIS — Z79899 Other long term (current) drug therapy: Secondary | ICD-10-CM | POA: Insufficient documentation

## 2022-09-19 DIAGNOSIS — Z87891 Personal history of nicotine dependence: Secondary | ICD-10-CM | POA: Diagnosis not present

## 2022-09-19 DIAGNOSIS — R059 Cough, unspecified: Secondary | ICD-10-CM | POA: Diagnosis present

## 2022-09-19 LAB — RESP PANEL BY RT-PCR (FLU A&B, COVID) ARPGX2
Influenza A by PCR: NEGATIVE
Influenza B by PCR: NEGATIVE
SARS Coronavirus 2 by RT PCR: POSITIVE — AB

## 2022-09-19 LAB — GROUP A STREP BY PCR: Group A Strep by PCR: NOT DETECTED

## 2022-09-19 NOTE — ED Triage Notes (Signed)
Pt c/o fever since Sunday, congestion, productive cough, sore throat, poor PO. Seen at [facility] 2 days ago, RX for abx but no covid/strep test. Pt states "it's gotten worse every day."

## 2022-09-20 ENCOUNTER — Emergency Department (HOSPITAL_BASED_OUTPATIENT_CLINIC_OR_DEPARTMENT_OTHER): Payer: Medicare HMO | Admitting: Radiology

## 2022-09-20 MED ORDER — SODIUM CHLORIDE 0.9 % IV BOLUS
1000.0000 mL | Freq: Once | INTRAVENOUS | Status: AC
  Administered 2022-09-20: 1000 mL via INTRAVENOUS

## 2022-09-20 MED ORDER — NIRMATRELVIR/RITONAVIR (PAXLOVID)TABLET: ORAL_TABLET | ORAL | 0 refills | Status: DC

## 2022-09-20 NOTE — ED Notes (Signed)
Reviewed AVS/discharge instruction with patient. Time allotted for and all questions answered. Patient is agreeable for d/c and escorted to ed exit by staff.  

## 2022-09-20 NOTE — ED Provider Notes (Signed)
[facility] Provider Note: Lowella Dell, MD, FACEP  CSN: [id number] MRN: [id number] ARRIVAL: 09/19/22 at 1848 ROOM: DB005/DB005   CHIEF COMPLAINT  Cough and Fever   HISTORY OF PRESENT ILLNESS  09/20/22 12:22 AM Kathy George is a 81 y.o. female with 5 days of nasal congestion, productive cough, sore throat and poor appetite.  She was seen at an urgent care 3 days ago but no testing was done.  She was placed on an antibiotic.  Nevertheless, she has gotten worse.  She rates her throat pain is at 10 out of 10, worse with swallowing.  She states it hurts so bad she has not had anything to eat or drink for 3 days and feels she is dehydrated.  She is not having any difficulty breathing.   Past Medical History:  Diagnosis Date   Anemia    Anxiety    CAD (coronary artery disease)    successful IVUs guided PCI of the lesion in the  proximal LAD using a promus drug-eluting stent with improvement in Fentanyl narrowing from 80% to 0%   Depression    Dyslipidemia    GERD (gastroesophageal reflux disease)    H/O: hysterectomy    Hiatal hernia    Hypertension    Paroxysmal atrial fibrillation (HCC)    Raynaud's disease    Scleroderma (HCC)    Supraventricular tachycardia    SUPRAVENTRICULAR TACHYCARDIA     Past Surgical History:  Procedure Laterality Date   BREAST ENHANCEMENT SURGERY     right rotator cuff repair     TOTAL ABDOMINAL HYSTERECTOMY      Family History  Problem Relation Age of Onset   Kidney disease Mother        chronic; intracranial hemorrhage   Coronary artery disease Other        grandfather   Hyperlipidemia Daughter    Rheum arthritis Maternal Aunt    Ovarian cancer Maternal Grandmother     Social History   Tobacco Use   Smoking status: Former    Packs/day: 0.10    Years: 1.00    Total pack years: 0.10    Types: Cigarettes    Quit date: 11/04/1960    Years since quitting: 61.9   Smokeless tobacco: Never   Tobacco comments:    only smoked  socially in early 20s  Substance Use Topics   Alcohol use: Yes    Alcohol/week: 2.0 - 3.0 standard drinks of alcohol    Types: 2 - 3 Glasses of wine per week    Comment: socially   Drug use: No    Prior to Admission medications   Medication Sig Start Date End Date Taking? Authorizing Provider  nirmatrelvir/ritonavir EUA (PAXLOVID) 20 x 150 MG & 10 x 100MG  TABS Take nirmatrelvir (150 mg) two tablets twice daily for 5 days and ritonavir (100 mg) one tablet twice daily for 5 days. 09/20/22  Yes Juanluis Guastella, MD  amLODipine (NORVASC) 5 MG tablet Take 1 tablet (5 mg total) by mouth daily. 01/14/19   03/16/19, MD  Cholecalciferol (VITAMIN D3) 2000 UNITS capsule Take 2,000 Units by mouth daily. 1 tablet once a day 09/11/11   [provider]  clopidogrel (PLAVIX) 75 MG tablet TAKE 1 TABLET (75 MG TOTAL) BY MOUTH DAILY. Patient taking differently: Take 75 mg by mouth daily. 12/07/15   02/04/16, MD  nitroGLYCERIN (NITROSTAT) 0.4 MG SL tablet Place 1 tablet (0.4 mg total) under the tongue every 5 (five) minutes as  needed. 06/14/20   Wendall Stade, MD  pantoprazole (PROTONIX) 40 MG tablet Take 80 mg by mouth daily. 08/15/18   [provider]  rosuvastatin (CRESTOR) 10 MG tablet Take 10 mg by mouth daily. 03/15/19   [provider]    Allergies Patient has no known allergies.   REVIEW OF SYSTEMS  Negative except as noted here or in the History of Present Illness.   PHYSICAL EXAMINATION  Initial Vital Signs Blood pressure 105/64, pulse 89, temperature 98.9 F (37.2 C), temperature source Oral, resp. rate 16, height 5\' 3"  (1.6 m), weight 60.8 kg, SpO2 98 %.  Examination General: Well-developed, well-nourished female in no acute distress; appearance consistent with age of record HENT: normocephalic; atraumatic me: Pharyngeal erythema and mild edema without exudate; uvula midline; no stridor; mild dysphonia Eyes: pupils equal, round and reactive to light;  extraocular muscles intact Neck: supple Heart: regular rate and rhythm Lungs: clear to auscultation bilaterally Abdomen: soft; nondistended; nontender; bowel sounds present Extremities: No deformity; full range of motion Neurologic: Awake, alert and oriented; motor function intact in all extremities and symmetric; no facial droop Skin: Warm and dry Psychiatric: Normal mood and affect   RESULTS  Summary of this visit's results, reviewed and interpreted by myself:   EKG Interpretation  Date/Time:    Ventricular Rate:    PR Interval:    QRS Duration:   QT Interval:    QTC Calculation:   R Axis:     Text Interpretation:         Laboratory Studies: Results for orders placed or performed during the hospital encounter of 09/19/22 (from the past 24 hour(s))  Group A Strep by PCR     Status: None   Collection Time: 09/19/22  7:08 PM   Specimen: Throat; Sterile Swab  Result Value Ref Range   Group A Strep by PCR NOT DETECTED NOT DETECTED  Resp Panel by RT-PCR (Flu A&B, Covid) Throat     Status: Abnormal   Collection Time: 09/19/22  7:08 PM   Specimen: Throat; Nasal Swab  Result Value Ref Range   SARS Coronavirus 2 by RT PCR POSITIVE (A) NEGATIVE   Influenza A by PCR NEGATIVE NEGATIVE   Influenza B by PCR NEGATIVE NEGATIVE   Imaging Studies: DG Neck Soft Tissue  Result Date: 09/20/2022 CLINICAL DATA:  Sore throat, COVID pneumonia, productive cough EXAM: NECK SOFT TISSUES - 1+ VIEW COMPARISON:  None Available. FINDINGS: There is no evidence of retropharyngeal soft tissue swelling or epiglottic enlargement. The cervical airway is unremarkable and no radio-opaque foreign body identified. IMPRESSION: Negative. Electronically Signed   By: 09/22/2022 M.D.   On: 09/20/2022 01:11   DG Chest 2 View  Result Date: 09/19/2022 CLINICAL DATA:  Upper respiratory infection. EXAM: CHEST - 2 VIEW COMPARISON:  Chest radiograph dated 08/04/2019. FINDINGS: No focal consolidation, pleural  effusion, or pneumothorax. The cardiac silhouette is within normal limits. Atherosclerotic calcification of the aorta. No acute osseous pathology. Osteopenia with degenerative changes of the spine. Bilateral breast implants. IMPRESSION: No active cardiopulmonary disease. Electronically Signed   By: 08/06/2019 M.D.   On: 09/19/2022 19:55    ED COURSE and MDM  Nursing notes, initial and subsequent vitals signs, including pulse oximetry, reviewed and interpreted by myself.  Vitals:   09/19/22 1911 09/20/22 0100 09/20/22 0109 09/20/22 0142  BP: 105/64 (!) 111/90  128/65  Pulse: 89  85 82  Resp: 16   16  Temp: 98.9 F (37.2 C)  TempSrc: Oral     SpO2: 98%  95% 95%  Weight:      Height:       Medications  sodium chloride 0.9 % bolus 1,000 mL (1,000 mLs Intravenous New Bag/Given 09/20/22 0110)   2:13 AM Patient doing better after IV fluid hydration.  We will start patient on Paxlovid as she is in a high risk group and is positive for COVID.  We will have her hold her statin for a week after discontinuing the Paxlovid.   PROCEDURES  Procedures   ED DIAGNOSES     ICD-10-CM   1. COVID-19 virus infection  U07.1     2. Dehydration  E86.0          Joban Colledge, Jonny Ruiz, MD 09/20/22 [phone]

## 2022-09-20 NOTE — ED Notes (Signed)
Patient transported to CT 

## 2022-09-20 NOTE — ED Notes (Signed)
Pt has been sick since Saturday, reports a sore throat, she has not been able to eat or barely drink r/t pain.  Has not been able to take her regular meds due to pain when swallowing.  VSS Pt does have a productive cough

## 2022-09-20 NOTE — Discharge Instructions (Addendum)
Stop taking your statin (Lipitor/lovastatin or Crestor/rosuvastatin) while taking Paxlovid and for 1 week afterwards.

## 2022-11-15 ENCOUNTER — Encounter: Payer: Self-pay | Admitting: Cardiovascular Disease

## 2023-01-21 ENCOUNTER — Ambulatory Visit: Payer: Medicare HMO | Admitting: Podiatry

## 2023-01-21 DIAGNOSIS — M7741 Metatarsalgia, right foot: Secondary | ICD-10-CM

## 2023-01-21 DIAGNOSIS — M7742 Metatarsalgia, left foot: Secondary | ICD-10-CM

## 2023-01-21 NOTE — Progress Notes (Signed)
Subjective:  Patient ID: Kathy George, female    DOB: 1941/02/02,  MRN: [id number]  Chief Complaint  Patient presents with   Callouses    82 y.o. female presents with the above complaint.  Patient presents with bilateral forefoot pain.  Patient states is painful to touch is progressive gotten worse hurts with ambulation worse with pressure she would like to discuss treatment options for this.  She states she has a hard time going barefooted on flat shoes.  She has not seen MRIs prior to seeing me pain scale 7 out of 10 dull achy in nature.   Review of Systems: Negative except as noted in the HPI. Denies N/V/F/Ch.  Past Medical History:  Diagnosis Date   Anemia    Anxiety    CAD (coronary artery disease)    successful IVUs guided PCI of the lesion in the  proximal LAD using a promus drug-eluting stent with improvement in Fentanyl narrowing from 80% to 0%   Depression    Dyslipidemia    GERD (gastroesophageal reflux disease)    H/O: hysterectomy    Hiatal hernia    Hypertension    Paroxysmal atrial fibrillation (HCC)    Raynaud's disease    Scleroderma ([place])    Supraventricular tachycardia    SUPRAVENTRICULAR TACHYCARDIA     Current Outpatient Medications:    amLODipine (NORVASC) 5 MG tablet, Take 1 tablet (5 mg total) by mouth daily., Disp: 30 tablet, Rfl: 6   Cholecalciferol (VITAMIN D3) 2000 UNITS capsule, Take 2,000 Units by mouth daily. 1 tablet once a day, Disp: , Rfl:    clopidogrel (PLAVIX) 75 MG tablet, TAKE 1 TABLET (75 MG TOTAL) BY MOUTH DAILY. (Patient taking differently: Take 75 mg by mouth daily.), Disp: 90 tablet, Rfl: 0   nirmatrelvir/ritonavir EUA (PAXLOVID) 20 x 150 MG & 10 x 100MG  TABS, Take nirmatrelvir (150 mg) two tablets twice daily for 5 days and ritonavir (100 mg) one tablet twice daily for 5 days., Disp: 30 tablet, Rfl: 0   nitroGLYCERIN (NITROSTAT) 0.4 MG SL tablet, Place 1 tablet (0.4 mg total) under the tongue every 5 (five) minutes as needed., Disp:  25 tablet, Rfl: 3   pantoprazole (PROTONIX) 40 MG tablet, Take 80 mg by mouth daily., Disp: , Rfl: 1   rosuvastatin (CRESTOR) 10 MG tablet, Take 10 mg by mouth daily., Disp: , Rfl:   Social History   Tobacco Use  Smoking Status Former   Packs/day: 0.10   Years: 1.00   Additional pack years: 0.00   Total pack years: 0.10   Types: Cigarettes   Quit date: 11/04/1960   Years since quitting: 62.2  Smokeless Tobacco Never  Tobacco Comments   only smoked socially in early 20s    No Known Allergies Objective:  There were no vitals filed for this visit. There is no height or weight on file to calculate BMI. Constitutional Well developed. Well nourished.  Vascular Dorsalis pedis pulses palpable bilaterally. Posterior tibial pulses palpable bilaterally. Capillary refill normal to all digits.  No cyanosis or clubbing noted. Pedal hair growth normal.  Neurologic Normal speech. Oriented to person, place, and time. Epicritic sensation to light touch grossly present bilaterally.  Dermatologic Nails well groomed and normal in appearance. No open wounds. No skin lesions.  Orthopedic: Bilateral forefoot metatarsalgia with generalized pain across metatarsal 1 through 5.  Negative Mulder's click noted.  Negative flexor tendinitis noted.  Pain across the ball of the foot.   Radiographs: None Assessment:   1.  Metatarsalgia of both feet    Plan:  Patient was evaluated and treated and all questions answered.  Bilateral forefoot metatarsalgia -All questions and concerns were discussed with the patient in extensive detail.  I discussed with the patient given the underlying plantar fat pad atrophy she is putting excessive stress in the ball of her foot while ambulating.  I discussed with her the importance of shoe gear modification as well as wearing metatarsal pads.  Also discussed orthotics as well.  She states understanding metatarsal pads were dispensed and shoe gear modifications were  discussed  No follow-ups on file.

## 2023-03-14 ENCOUNTER — Ambulatory Visit: Payer: Medicare HMO | Admitting: Podiatry

## 2023-03-14 DIAGNOSIS — Q828 Other specified congenital malformations of skin: Secondary | ICD-10-CM | POA: Diagnosis not present

## 2023-03-14 NOTE — Progress Notes (Signed)
Subjective:  Patient ID: Kathy George, female    DOB: 12-Apr-1941,  MRN: [id number]  Chief Complaint  Patient presents with   Callouses    Left big toe     82 y.o. female presents with the above complaint.  Patient presents with left hallux porokeratotic lesion painful to touch is progressive gotten worse hurts with ambulation worse with pressure she would like to have it debrided and she has not seen anyone else prior to seeing me.  Denies any other acute complaints.  Pain scale 7 out of 10.   Review of Systems: Negative except as noted in the HPI. Denies N/V/F/Ch.  Past Medical History:  Diagnosis Date   Anemia    Anxiety    CAD (coronary artery disease)    successful IVUs guided PCI of the lesion in the  proximal LAD using a promus drug-eluting stent with improvement in Fentanyl narrowing from 80% to 0%   Depression    Dyslipidemia    GERD (gastroesophageal reflux disease)    H/O: hysterectomy    Hiatal hernia    Hypertension    Paroxysmal atrial fibrillation (HCC)    Raynaud's disease    Scleroderma (HCC)    Supraventricular tachycardia    SUPRAVENTRICULAR TACHYCARDIA     Current Outpatient Medications:    amLODipine (NORVASC) 5 MG tablet, Take 1 tablet (5 mg total) by mouth daily., Disp: 30 tablet, Rfl: 6   Cholecalciferol (VITAMIN D3) 2000 UNITS capsule, Take 2,000 Units by mouth daily. 1 tablet once a day, Disp: , Rfl:    clopidogrel (PLAVIX) 75 MG tablet, TAKE 1 TABLET (75 MG TOTAL) BY MOUTH DAILY. (Patient taking differently: Take 75 mg by mouth daily.), Disp: 90 tablet, Rfl: 0   nirmatrelvir/ritonavir EUA (PAXLOVID) 20 x 150 MG & 10 x 100MG  TABS, Take nirmatrelvir (150 mg) two tablets twice daily for 5 days and ritonavir (100 mg) one tablet twice daily for 5 days., Disp: 30 tablet, Rfl: 0   nitroGLYCERIN (NITROSTAT) 0.4 MG SL tablet, Place 1 tablet (0.4 mg total) under the tongue every 5 (five) minutes as needed., Disp: 25 tablet, Rfl: 3   pantoprazole (PROTONIX) 40  MG tablet, Take 80 mg by mouth daily., Disp: , Rfl: 1   rosuvastatin (CRESTOR) 10 MG tablet, Take 10 mg by mouth daily., Disp: , Rfl:   Social History   Tobacco Use  Smoking Status Former   Packs/day: 0.10   Years: 1.00   Additional pack years: 0.00   Total pack years: 0.10   Types: Cigarettes   Quit date: 11/04/1960   Years since quitting: 62.3  Smokeless Tobacco Never  Tobacco Comments   only smoked socially in early 20s    No Known Allergies Objective:  There were no vitals filed for this visit. There is no height or weight on file to calculate BMI. Constitutional Well developed. Well nourished.  Vascular Dorsalis pedis pulses palpable bilaterally. Posterior tibial pulses palpable bilaterally. Capillary refill normal to all digits.  No cyanosis or clubbing noted. Pedal hair growth normal.  Neurologic Normal speech. Oriented to person, place, and time. Epicritic sensation to light touch grossly present bilaterally.  Dermatologic Hyperkeratotic lesion with central nucleated core noted to left hallux.  Pain on palpation to the lesion.  Orthopedic: Normal joint ROM without pain or crepitus bilaterally. No visible deformities. No bony tenderness.   Radiographs: None Assessment:   1. Porokeratosis    Plan:  Patient was evaluated and treated and all questions answered.  Left hallux  porokeratosis -All questions and concerns were discussed with the patient in extensive detail -Given the amount of pain that she is having she will benefit from debridement the lesion using chisel blade handle lesion was debrided down to healthy dry tissue no complication noted no pinpoint bleeding noted. -Discussed shoe gear modifications  No follow-ups on file.

## 2023-06-01 ENCOUNTER — Other Ambulatory Visit: Payer: Self-pay

## 2023-06-01 ENCOUNTER — Emergency Department (HOSPITAL_BASED_OUTPATIENT_CLINIC_OR_DEPARTMENT_OTHER)
Admission: EM | Admit: 2023-06-01 | Discharge: 2023-06-01 | Disposition: A | Discharge status: Home / Self Care | Payer: Medicare HMO | Attending: Emergency Medicine | Admitting: Emergency Medicine

## 2023-06-01 ENCOUNTER — Encounter (HOSPITAL_BASED_OUTPATIENT_CLINIC_OR_DEPARTMENT_OTHER): Payer: Self-pay | Admitting: *Deleted

## 2023-06-01 ENCOUNTER — Emergency Department (HOSPITAL_BASED_OUTPATIENT_CLINIC_OR_DEPARTMENT_OTHER): Payer: Medicare HMO | Admitting: Radiology

## 2023-06-01 DIAGNOSIS — Z7902 Long term (current) use of antithrombotics/antiplatelets: Secondary | ICD-10-CM | POA: Diagnosis not present

## 2023-06-01 DIAGNOSIS — I10 Essential (primary) hypertension: Secondary | ICD-10-CM | POA: Diagnosis present

## 2023-06-01 DIAGNOSIS — Z79899 Other long term (current) drug therapy: Secondary | ICD-10-CM | POA: Insufficient documentation

## 2023-06-01 DIAGNOSIS — I160 Hypertensive urgency: Secondary | ICD-10-CM

## 2023-06-01 LAB — CBC WITH DIFFERENTIAL/PLATELET
Abs Immature Granulocytes: 0.01 10*3/uL (ref 0.00–0.07)
Basophils Absolute: 0 10*3/uL (ref 0.0–0.1)
Basophils Relative: 1 %
Eosinophils Absolute: 0.4 10*3/uL (ref 0.0–0.5)
Eosinophils Relative: 6 %
HCT: 40.1 % (ref 36.0–46.0)
Hemoglobin: 13.3 g/dL (ref 12.0–15.0)
Immature Granulocytes: 0 %
Lymphocytes Relative: 39 %
Lymphs Abs: 2.3 10*3/uL (ref 0.7–4.0)
MCH: 29.4 pg (ref 26.0–34.0)
MCHC: 33.2 g/dL (ref 30.0–36.0)
MCV: 88.5 fL (ref 80.0–100.0)
Monocytes Absolute: 0.4 10*3/uL (ref 0.1–1.0)
Monocytes Relative: 8 %
Neutro Abs: 2.8 10*3/uL (ref 1.7–7.7)
Neutrophils Relative %: 46 %
Platelets: 189 10*3/uL (ref 150–400)
RBC: 4.53 MIL/uL (ref 3.87–5.11)
RDW: 14.2 % (ref 11.5–15.5)
WBC: 5.9 10*3/uL (ref 4.0–10.5)
nRBC: 0 % (ref 0.0–0.2)

## 2023-06-01 LAB — BASIC METABOLIC PANEL
Anion gap: 9 (ref 5–15)
BUN: 25 mg/dL — ABNORMAL HIGH (ref 8–23)
CO2: 28 mmol/L (ref 22–32)
Calcium: 10.2 mg/dL (ref 8.9–10.3)
Chloride: 103 mmol/L (ref 98–111)
Creatinine, Ser: 1.03 mg/dL — ABNORMAL HIGH (ref 0.44–1.00)
GFR, Estimated: 54 mL/min — ABNORMAL LOW (ref >60)
Glucose, Bld: 106 mg/dL — ABNORMAL HIGH (ref 70–99)
Potassium: 3.2 mmol/L — ABNORMAL LOW (ref 3.5–5.1)
Sodium: 140 mmol/L (ref 135–145)

## 2023-06-01 LAB — TROPONIN I (HIGH SENSITIVITY): Troponin I (High Sensitivity): 5 ng/L (ref <18)

## 2023-06-01 MED ORDER — CLONIDINE HCL 0.1 MG PO TABS
0.1000 mg | ORAL_TABLET | Freq: Once | ORAL | Status: AC
  Administered 2023-06-01: 0.1 mg via ORAL
  Filled 2023-06-01: qty 1

## 2023-06-01 NOTE — ED Provider Notes (Signed)
[place] EMERGENCY DEPARTMENT AT [facility] Provider Note   CSN: [id number] Arrival date & time: 06/01/23  0127     History  Chief Complaint  Patient presents with   Hypertension    Kathy George is a 82 y.o. female.  Patient is an 82 year old female with history of hypertension, paroxysmal A-fib, prior TIA.  Patient presenting today for evaluation of elevated blood pressure.  She began checking it this morning and it seemed elevated.  It continued to rise throughout the day.  She increased her amlodipine and 2.5 mg increments as instructed by her cardiologist, however blood pressure seems to be continuing to be high.  It was 190 systolic upon presentation.  She denies to me she is having any headache, chest pain, or other complaints.  The history is provided by the patient.       Home Medications Prior to Admission medications   Medication Sig Start Date End Date Taking? Authorizing Provider  amLODipine (NORVASC) 5 MG tablet Take 1 tablet (5 mg total) by mouth daily. 01/14/19   Wendall Stade, MD  Cholecalciferol (VITAMIN D3) 2000 UNITS capsule Take 2,000 Units by mouth daily. 1 tablet once a day 09/11/11   [provider]  clopidogrel (PLAVIX) 75 MG tablet TAKE 1 TABLET (75 MG TOTAL) BY MOUTH DAILY. Patient taking differently: Take 75 mg by mouth daily. 12/07/15   Wendall Stade, MD  nitroGLYCERIN (NITROSTAT) 0.4 MG SL tablet Place 1 tablet (0.4 mg total) under the tongue every 5 (five) minutes as needed. 06/14/20   Wendall Stade, MD  pantoprazole (PROTONIX) 40 MG tablet Take 80 mg by mouth daily. 08/15/18   [provider]  rosuvastatin (CRESTOR) 10 MG tablet Take 10 mg by mouth daily. 03/15/19   [provider]      Allergies    Patient has no known allergies.    Review of Systems   Review of Systems  All other systems reviewed and are negative.   Physical Exam Updated Vital Signs BP (!) 182/75   Pulse 69   Temp 97.7 F (36.5 C)    Resp (!) 27   Ht 5\' 3"  (1.6 m)   Wt 61.2 kg   SpO2 100%   BMI 23.91 kg/m  Physical Exam Vitals and nursing note reviewed.  Constitutional:      General: She is not in acute distress.    Appearance: She is well-developed. She is not diaphoretic.  HENT:     Head: Normocephalic and atraumatic.  Eyes:     Extraocular Movements: Extraocular movements intact.     Pupils: Pupils are equal, round, and reactive to light.  Cardiovascular:     Rate and Rhythm: Normal rate and regular rhythm.     Heart sounds: No murmur heard.    No friction rub. No gallop.  Pulmonary:     Effort: Pulmonary effort is normal. No respiratory distress.     Breath sounds: Normal breath sounds. No wheezing.  Abdominal:     General: Bowel sounds are normal. There is no distension.     Palpations: Abdomen is soft.     Tenderness: There is no abdominal tenderness.  Musculoskeletal:        General: Normal range of motion.     Cervical back: Normal range of motion and neck supple.  Skin:    General: Skin is warm and dry.  Neurological:     General: No focal deficit present.     Mental Status: She  is alert and oriented to person, place, and time.     Cranial Nerves: No cranial nerve deficit.     Motor: No weakness.     ED Results / Procedures / Treatments   Labs (all labs ordered are listed, but only abnormal results are displayed) Labs Reviewed  CBC WITH DIFFERENTIAL/PLATELET  BASIC METABOLIC PANEL  TROPONIN I (HIGH SENSITIVITY)    EKG EKG Interpretation Date/Time:  Sunday June 01 2023 01:38:25 EDT Ventricular Rate:  81 PR Interval:  166 QRS Duration:  80 QT Interval:  370 QTC Calculation: 429 R Axis:   -68  Text Interpretation: Normal sinus rhythm Left axis deviation Inferior infarct , age undetermined Anterior infarct , age undetermined Abnormal ECG When compared with ECG of 04-Aug-2019 05:55, no significant change is noted Confirmed by Geoffery Lyons ([id number]) on 06/01/2023 1:49:53  AM  Radiology DG Chest Port 1 View  Result Date: 06/01/2023 CLINICAL DATA:  Headache and hypertension. EXAM: PORTABLE CHEST 1 VIEW COMPARISON:  September 19, 2022 FINDINGS: The heart size and mediastinal contours are within normal limits. There is marked severity calcification of the aortic arch. Both lungs are clear. Calcified bilateral breast implants are seen. Multilevel degenerative changes are seen throughout the thoracic spine. No acute osseous abnormalities are identified. IMPRESSION: No active cardiopulmonary disease. Electronically Signed   By: Aram Candela M.D.   On: 06/01/2023 02:20    Procedures Procedures    Medications Ordered in ED Medications  cloNIDine (CATAPRES) tablet 0.1 mg (has no administration in time range)    ED Course/ Medical Decision Making/ A&P  Patient is an 82 year old female with past medical history as per HPI presenting with elevated blood pressure.  Patient arrives here with stable vital signs and is afebrile.  Initial blood pressure is 190/81.  Patient is neurologically intact and physical examination is unremarkable.  Workup initiated including CBC, metabolic panel, and troponin, all of which are unremarkable.  Chest x-ray negative.  The patient given 0.1 mg of clonidine with good results.  Her blood pressure is now 138/71 and she is feeling better.  Blood work shows no sign of endorgan damage and I feel as though patient can safely be discharged.  She is normally taking 2.5 mg of amlodipine twice a day.  I will have her increase the morning dose to 5 mg and follow-up with her primary doctor.  Final Clinical Impression(s) / ED Diagnoses Final diagnoses:  None    Rx / DC Orders ED Discharge Orders     None         Geoffery Lyons, MD 06/01/23 [phone]

## 2023-06-01 NOTE — ED Triage Notes (Addendum)
Pt states that her b/p has been elevated on Saturday. Took at total of 10mg  of Amlodipine  ( last dose of 2.5mg  was 2100) denies any cp/sob. C/o  ha. States her cardiologist wanted her to be seen for her blood pressure.

## 2023-06-01 NOTE — Discharge Instructions (Signed)
Increase your amlodipine to 5 mg in the morning and 2.5 mg in the evening.  Keep a record of your blood pressures and follow-up with your doctor in the next few days.  Return to the ER if blood pressures persist greater than 200 systolic and are not improving with your medications.

## 2023-06-03 ENCOUNTER — Telehealth: Payer: Self-pay | Admitting: Cardiovascular Disease

## 2023-06-03 NOTE — Telephone Encounter (Signed)
Left message to call office

## 2023-06-03 NOTE — Telephone Encounter (Signed)
I spoke with patient.  She reports PCP has been following her BP recently.  Reports BP was running low (around 112) and she was feeling lightheaded so amlodipine was changed to 2.5 mg twice daily.  After two days of this change her BP was running high and patient was evaluated in ED on Friday evening..  Patient reports she was given medication in the ED but no other BP changes were made.  She got back home early Saturday morning and stayed in bed all day.  States BP was elevated all day on Saturday. Was not over 180 but she did not write readings down and BP cuff does not have memory.  She took amlodipine 2.5 mg twice on Saturday. On Sunday she did not feel well.  She took a total of 10 mg amlodipine on Sunday due to elevated BP.  Does not know readings.  On Monday she took a total of 10 mg amlodipine for continued elevated BP.  BP readings for Monday are not available. Today patient reports BP was 156/60 prior to taking amlodipine.  She took 5 mg amlodipine this AM and has not rechecked her BP.  Patient reports she continues to not feel well.  She reports head feels full of water and legs are weak.  Leg weakness has been going on for awhile per patient report.  She is not having any chest pain. She is not able to see PCP.  I advised her to go to Urgent Care for evaluation as her symptoms did not all sound BP related.  I asked her to monitor BP and record readings and call readings to office in a few days. Patient reports she is due for follow up with Dr Eden Emms and requests appointment.  Will send to Dr Fabio Bering nurse regarding appointment.

## 2023-06-03 NOTE — Telephone Encounter (Signed)
Patient is requesting to speak with Dr. Eden Emms or nurse in regards to her being in the hospital for hypertension

## 2023-06-04 NOTE — Telephone Encounter (Signed)
Per Dr. Eden Emms, this does not seem be be a cardiac issue or even totally related to her BP variation She does not need to f/u with [place] except routine.  Left message for patient to call back.

## 2023-06-05 NOTE — Telephone Encounter (Signed)
Patient called back. Informed her of Dr. Fabio Bering recommendations. Patient will call her PCP and get an appointment to see him.

## 2023-06-24 ENCOUNTER — Telehealth: Payer: Self-pay | Admitting: Cardiovascular Disease

## 2023-06-24 NOTE — Telephone Encounter (Signed)
Called patient back about her message. Patient complained of dizziness, that has been going on for a year or more. Patient stated her dizziness comes and goes. Patient stated her BP can be fine and she still has dizziness. Today patient's BP 124/83 and HR 70. Patient had a mobile EKG device that shows no A. FIB. Patient wants to know if Dr. Eden Emms has any other ideas what might be causing her dizziness. Will forward to Dr. Eden Emms for advisement.

## 2023-06-24 NOTE — Telephone Encounter (Signed)
STAT if patient feels like he/she is going to faint   Are you dizzy now? Yes  Do you feel faint or have you passed out? No   Do you have any other symptoms? No  Have you checked your HR and BP (record if available)? Yes BP124/83

## 2023-06-26 NOTE — Telephone Encounter (Signed)
Left message for patient to call back  

## 2023-06-27 ENCOUNTER — Telehealth: Payer: Self-pay | Admitting: Cardiovascular Disease

## 2023-06-27 NOTE — Telephone Encounter (Signed)
Follow Up:       Patient is returning a call  from [facility]', Dr Fabio Bering nurse.

## 2023-06-27 NOTE — Telephone Encounter (Signed)
Returned Pt's call. Gave her recommendation to see PCP per Dr. Eden Emms. Pt stated understanding.

## 2023-07-17 ENCOUNTER — Encounter (HOSPITAL_BASED_OUTPATIENT_CLINIC_OR_DEPARTMENT_OTHER): Payer: Self-pay | Admitting: Emergency Medicine

## 2023-07-17 ENCOUNTER — Other Ambulatory Visit: Payer: Self-pay

## 2023-07-17 ENCOUNTER — Emergency Department (HOSPITAL_BASED_OUTPATIENT_CLINIC_OR_DEPARTMENT_OTHER)
Admission: EM | Admit: 2023-07-17 | Discharge: 2023-07-17 | Disposition: A | Discharge status: Home / Self Care | Payer: Medicare HMO | Attending: Emergency Medicine | Admitting: Emergency Medicine

## 2023-07-17 ENCOUNTER — Emergency Department (HOSPITAL_BASED_OUTPATIENT_CLINIC_OR_DEPARTMENT_OTHER): Payer: Medicare HMO

## 2023-07-17 DIAGNOSIS — R9082 White matter disease, unspecified: Secondary | ICD-10-CM | POA: Diagnosis not present

## 2023-07-17 DIAGNOSIS — R030 Elevated blood-pressure reading, without diagnosis of hypertension: Secondary | ICD-10-CM | POA: Diagnosis present

## 2023-07-17 DIAGNOSIS — Z79899 Other long term (current) drug therapy: Secondary | ICD-10-CM | POA: Insufficient documentation

## 2023-07-17 DIAGNOSIS — I2510 Atherosclerotic heart disease of native coronary artery without angina pectoris: Secondary | ICD-10-CM | POA: Diagnosis not present

## 2023-07-17 DIAGNOSIS — Z87891 Personal history of nicotine dependence: Secondary | ICD-10-CM | POA: Diagnosis not present

## 2023-07-17 DIAGNOSIS — G319 Degenerative disease of nervous system, unspecified: Secondary | ICD-10-CM | POA: Insufficient documentation

## 2023-07-17 DIAGNOSIS — I10 Essential (primary) hypertension: Secondary | ICD-10-CM | POA: Insufficient documentation

## 2023-07-17 LAB — BASIC METABOLIC PANEL
Anion gap: 13 (ref 5–15)
BUN: 25 mg/dL — ABNORMAL HIGH (ref 8–23)
CO2: 25 mmol/L (ref 22–32)
Calcium: 9.5 mg/dL (ref 8.9–10.3)
Chloride: 101 mmol/L (ref 98–111)
Creatinine, Ser: 1.05 mg/dL — ABNORMAL HIGH (ref 0.44–1.00)
GFR, Estimated: 52 mL/min — ABNORMAL LOW (ref >60)
Glucose, Bld: 89 mg/dL (ref 70–99)
Potassium: 3.7 mmol/L (ref 3.5–5.1)
Sodium: 139 mmol/L (ref 135–145)

## 2023-07-17 LAB — CBC
HCT: 38.1 % (ref 36.0–46.0)
Hemoglobin: 12.6 g/dL (ref 12.0–15.0)
MCH: 28.8 pg (ref 26.0–34.0)
MCHC: 33.1 g/dL (ref 30.0–36.0)
MCV: 87.2 fL (ref 80.0–100.0)
Platelets: 190 10*3/uL (ref 150–400)
RBC: 4.37 MIL/uL (ref 3.87–5.11)
RDW: 15 % (ref 11.5–15.5)
WBC: 6.3 10*3/uL (ref 4.0–10.5)
nRBC: 0 % (ref 0.0–0.2)

## 2023-07-17 MED ORDER — ACETAMINOPHEN 500 MG PO TABS
1000.0000 mg | ORAL_TABLET | Freq: Once | ORAL | Status: AC
  Administered 2023-07-17: 1000 mg via ORAL
  Filled 2023-07-17: qty 2

## 2023-07-17 MED ORDER — LOSARTAN POTASSIUM 50 MG PO TABS: 50.0000 mg | ORAL_TABLET | Freq: Every day | ORAL | 1 refills | Status: DC

## 2023-07-17 NOTE — ED Notes (Signed)
Discharge paperwork given and verbally understood. 

## 2023-07-17 NOTE — ED Provider Notes (Signed)
[place] EMERGENCY DEPARTMENT AT [facility] Provider Note  CSN: [id number] Arrival date & time: 07/17/23 1620  Chief Complaint(s) Hypertension  HPI Kathy George is a 82 y.o. female history of coronary artery disease, paroxysmal A-fib, scleroderma, hypertension presenting to the emergency department with high blood pressure.  Patient reports around 3 weeks of high blood pressure, mild headaches.  She reports some generalized weakness as well.  Has seen her primary doctor for this and had some medication adjustments but blood pressure remained high.  She tried to see her primary doctor today but they did not have any availability so she came to the emergency department.  No fevers or chills, no numbness or tingling, no focal weakness, no loss of consciousness, no nausea or vomiting, neck stiffness.  No chest pain, abdominal pain.  She tried to take extra losartan today but remains high.   Past Medical History Past Medical History:  Diagnosis Date   Anemia    Anxiety    CAD (coronary artery disease)    successful IVUs guided PCI of the lesion in the  proximal LAD using a promus drug-eluting stent with improvement in Fentanyl narrowing from 80% to 0%   Depression    Dyslipidemia    GERD (gastroesophageal reflux disease)    H/O: hysterectomy    Hiatal hernia    Hypertension    Paroxysmal atrial fibrillation (HCC)    Raynaud's disease    Scleroderma (HCC)    Supraventricular tachycardia    SUPRAVENTRICULAR TACHYCARDIA    Patient Active Problem List   Diagnosis Date Noted   Bradycardia 12/13/2014   TIA (transient ischemic attack) 05/27/2014   Skin lesion 10/13/2013   Cough 01/17/2012   Scleroderma (HCC) 01/17/2012   Edema 08/05/2011   THYROID NODULE, RIGHT 07/03/2009   Elevated lipids 12/31/2007   ANEMIA 12/31/2007   ANXIETY 12/31/2007   DEPRESSION 12/31/2007   Essential hypertension 12/31/2007   Coronary atherosclerosis 12/31/2007   ATRIAL FIBRILLATION, PAROXYSMAL  12/31/2007   SUPRAVENTRICULAR TACHYCARDIA 12/31/2007   RAYNAUD'S DISEASE 12/31/2007   GERD 12/31/2007   HIATAL HERNIA 12/31/2007   Home Medication(s) Prior to Admission medications   Medication Sig Start Date End Date Taking? Authorizing Provider  losartan (COZAAR) 50 MG tablet Take 1 tablet (50 mg total) by mouth daily. 07/17/23  Yes Lonell Grandchild, MD  Cholecalciferol (VITAMIN D3) 2000 UNITS capsule Take 2,000 Units by mouth daily. 1 tablet once a day 09/11/11   [provider]  clopidogrel (PLAVIX) 75 MG tablet TAKE 1 TABLET (75 MG TOTAL) BY MOUTH DAILY. Patient taking differently: Take 75 mg by mouth daily. 12/07/15   Wendall Stade, MD  nitroGLYCERIN (NITROSTAT) 0.4 MG SL tablet Place 1 tablet (0.4 mg total) under the tongue every 5 (five) minutes as needed. 06/14/20   Wendall Stade, MD  pantoprazole (PROTONIX) 40 MG tablet Take 80 mg by mouth daily. 08/15/18   [provider]  rosuvastatin (CRESTOR) 10 MG tablet Take 10 mg by mouth daily. 03/15/19   [provider]  Past Surgical History Past Surgical History:  Procedure Laterality Date   BREAST ENHANCEMENT SURGERY     right rotator cuff repair     TOTAL ABDOMINAL HYSTERECTOMY     Family History Family History  Problem Relation Age of Onset   Kidney disease Mother        chronic; intracranial hemorrhage   Coronary artery disease Other        grandfather   Hyperlipidemia Daughter    Rheum arthritis Maternal Aunt    Ovarian cancer Maternal Grandmother     Social History Social History   Tobacco Use   Smoking status: Former    Current packs/day: 0.00    Average packs/day: 0.1 packs/day for 1 year (0.1 ttl pk-yrs)    Types: Cigarettes    Start date: 11/05/1959    Quit date: 11/04/1960    Years since quitting: 62.7   Smokeless tobacco: Never   Tobacco comments:     only smoked socially in early 20s  Substance Use Topics   Alcohol use: Yes    Alcohol/week: 2.0 - 3.0 standard drinks of alcohol    Types: 2 - 3 Glasses of wine per week    Comment: socially   Drug use: No   Allergies Patient has no known allergies.  Review of Systems Review of Systems  All other systems reviewed and are negative.   Physical Exam Vital Signs  I have reviewed the triage vital signs BP (!) 178/73 (BP Location: Right Arm)   Pulse (!) 57   Temp 98 F (36.7 C)   Resp 16   Ht 5' 3.5" (1.613 m)   Wt 61.2 kg   SpO2 95%   BMI 23.54 kg/m  Physical Exam Vitals and nursing note reviewed.  Constitutional:      General: She is not in acute distress.    Appearance: She is well-developed.  HENT:     Head: Normocephalic and atraumatic.     Mouth/Throat:     Mouth: Mucous membranes are moist.  Eyes:     Pupils: Pupils are equal, round, and reactive to light.  Cardiovascular:     Rate and Rhythm: Normal rate and regular rhythm.     Heart sounds: No murmur heard. Pulmonary:     Effort: Pulmonary effort is normal. No respiratory distress.     Breath sounds: Normal breath sounds.  Abdominal:     General: Abdomen is flat.     Palpations: Abdomen is soft.     Tenderness: There is no abdominal tenderness.  Musculoskeletal:        General: No tenderness.     Right lower leg: No edema.     Left lower leg: No edema.  Skin:    General: Skin is warm and dry.  Neurological:     General: No focal deficit present.     Mental Status: She is alert. Mental status is at baseline.     Comments: Cranial nerves II through XII intact, strength 5 out of 5 in the bilateral upper and lower extremities, no sensory deficit to light touch  Psychiatric:        Mood and Affect: Mood normal.        Behavior: Behavior normal.     ED Results and Treatments Labs (all labs ordered are listed, but only abnormal results are displayed) Labs Reviewed  BASIC METABOLIC PANEL - Abnormal;  Notable for the following components:      Result Value   BUN 25 (*)  Creatinine, Ser 1.05 (*)    GFR, Estimated 52 (*)    All other components within normal limits  CBC                                                                                                                          Radiology CT Head Wo Contrast  Result Date: 07/17/2023 CLINICAL DATA:  Elevated blood pressure and headache. EXAM: CT HEAD WITHOUT CONTRAST TECHNIQUE: Contiguous axial images were obtained from the base of the skull through the vertex without intravenous contrast. RADIATION DOSE REDUCTION: This exam was performed according to the departmental dose-optimization program which includes automated exposure control, adjustment of the mA and/or kV according to patient size and/or use of iterative reconstruction technique. COMPARISON:  August 04, 2019 FINDINGS: Brain: There is mild cerebral atrophy with widening of the extra-axial spaces and ventricular dilatation. There are areas of decreased attenuation within the white matter tracts of the supratentorial brain, consistent with microvascular disease changes. Vascular: No hyperdense vessel or unexpected calcification. Skull: Normal. Negative for fracture or focal lesion. Sinuses/Orbits: No acute finding. Other: None. IMPRESSION: 1. Generalized cerebral atrophy with chronic white matter small vessel ischemic changes. 2. No acute intracranial abnormality. Electronically Signed   By: Aram Candela M.D.   On: 07/17/2023 18:57    Pertinent labs & imaging results that were available during my care of the patient were reviewed by me and considered in my medical decision making (see MDM for details).  Medications Ordered in ED Medications  acetaminophen (TYLENOL) tablet 1,000 mg (1,000 mg Oral Given 07/17/23 1709)                                                                                                                                      Procedures Procedures  (including critical care time)  Medical Decision Making / ED Course   MDM:  82 year old presenting with high blood pressure, headache.  Patient overall well-appearing, vital signs with hypertension, no tachycardia, no fevers.  Neurologic exam is normal.  Suspect likely chronic hypertension.  Given headache in elderly patient will obtain CT head but lower concern for any acute intracranial process such as bleeding or brain tumor.  She has not taken anything for this today so we will give Tylenol.  Also very low concern for unusual process such as carbon monoxide poisoning or venous sinus thrombosis.  Basic labs reassuring  and about at patient's baseline.  Will reassess.  Can make blood pressure medication adjustment if blood pressure continues to be elevated.  Clinical Course as of 07/17/23 1929  Thu Jul 17, 2023  1928 CT head negative.  Labs consistent with baseline labs.  Patient reports that she has been taking 25 mg of losartan daily at home but still having high blood pressure.  Will increase her dose to 50 mg, advised checking blood pressure once daily, using caution when first taking larger dose to make sure that she does not have low blood pressure and at high risk for falls.  Advise close follow-up with her primary doctor. Will discharge patient to home. All questions answered. Patient comfortable with plan of discharge. Return precautions discussed with patient and specified on the after visit summary.  [WS]    Clinical Course User Index [WS] Suezanne Jacquet, Jerilee Field, MD     Additional history obtained: -Additional history obtained from family -External records from outside source obtained and reviewed including: Chart review including previous notes, labs, imaging, consultation notes including previous PMD notes   Lab Tests: -I ordered, reviewed, and interpreted labs.   The pertinent results include:   Labs Reviewed  BASIC METABOLIC PANEL - Abnormal;  Notable for the following components:      Result Value   BUN 25 (*)    Creatinine, Ser 1.05 (*)    GFR, Estimated 52 (*)    All other components within normal limits  CBC    Notable for mild CKD stable   Imaging Studies ordered: I ordered imaging studies including CT head On my interpretation imaging demonstrates no acute process I independently visualized and interpreted imaging. I agree with the radiologist interpretation   Medicines ordered and prescription drug management: Meds ordered this encounter  Medications   acetaminophen (TYLENOL) tablet 1,000 mg   losartan (COZAAR) 50 MG tablet    Sig: Take 1 tablet (50 mg total) by mouth daily.    Dispense:  30 tablet    Refill:  1    -I have reviewed the patients home medicines and have made adjustments as needed  Cardiac Monitoring: The patient was maintained on a cardiac monitor.  I personally viewed and interpreted the cardiac monitored which showed an underlying rhythm of: NSR  Reevaluation: After the interventions noted above, I reevaluated the patient and found that their symptoms have improved  Co morbidities that complicate the patient evaluation  Past Medical History:  Diagnosis Date   Anemia    Anxiety    CAD (coronary artery disease)    successful IVUs guided PCI of the lesion in the  proximal LAD using a promus drug-eluting stent with improvement in Fentanyl narrowing from 80% to 0%   Depression    Dyslipidemia    GERD (gastroesophageal reflux disease)    H/O: hysterectomy    Hiatal hernia    Hypertension    Paroxysmal atrial fibrillation (HCC)    Raynaud's disease    Scleroderma (HCC)    Supraventricular tachycardia    SUPRAVENTRICULAR TACHYCARDIA       Dispostion: Disposition decision including need for hospitalization was considered, and patient discharged from emergency department.    Final Clinical Impression(s) / ED Diagnoses Final diagnoses:  Hypertension, unspecified type     This  chart was dictated using voice recognition software.  Despite best efforts to proofread,  errors can occur which can change the documentation meaning.    Lonell Grandchild, MD 07/17/23 1929

## 2023-07-17 NOTE — Discharge Instructions (Addendum)
We evaluated you for your high blood pressure.  Your blood pressure was elevated in the emergency department.  Your laboratory testing and CT scan were reassuring.  Typically, blood pressure causes problems over a longer time period such as months to years.  I have prescribed you 50 mg of losartan.  You can take 1 tablet daily.  Please check your blood pressure once daily and keep a log.  Please follow-up closely with your primary doctor.  If you have any new or worsening symptoms such as severe headaches, numbness or tingling, weakness on one side of your body, vision changes, fainting, chest pain, leg swelling, difficulty breathing, or any other new symptoms, please return to the emergency department for reassessment.

## 2023-07-17 NOTE — ED Triage Notes (Signed)
Pt arrives to ED with c/o elevated blood pressures over the past week. Pt notes that today her blood pressure has been in the 190s. Pt notes that she has been taking extra Losartan this week w/o relief. Pt notes she was taken off amlodipine x3 weeks ago due to hypotension.

## 2023-07-22 NOTE — Telephone Encounter (Signed)
See phone encounter from 06/27/23.

## 2023-10-18 ENCOUNTER — Encounter (HOSPITAL_BASED_OUTPATIENT_CLINIC_OR_DEPARTMENT_OTHER): Payer: Self-pay

## 2023-10-18 ENCOUNTER — Inpatient Hospital Stay (HOSPITAL_BASED_OUTPATIENT_CLINIC_OR_DEPARTMENT_OTHER)
Admission: EM | Admit: 2023-10-18 | Discharge: 2023-10-21 | DRG: 305 | Disposition: A | Discharge status: Home / Self Care | Payer: Medicare HMO | Attending: Internal Medicine | Admitting: Internal Medicine

## 2023-10-18 ENCOUNTER — Emergency Department (HOSPITAL_BASED_OUTPATIENT_CLINIC_OR_DEPARTMENT_OTHER): Payer: Medicare HMO

## 2023-10-18 DIAGNOSIS — N1832 Chronic kidney disease, stage 3b: Secondary | ICD-10-CM | POA: Diagnosis present

## 2023-10-18 DIAGNOSIS — I7300 Raynaud's syndrome without gangrene: Secondary | ICD-10-CM | POA: Diagnosis present

## 2023-10-18 DIAGNOSIS — K219 Gastro-esophageal reflux disease without esophagitis: Secondary | ICD-10-CM | POA: Diagnosis present

## 2023-10-18 DIAGNOSIS — I169 Hypertensive crisis, unspecified: Secondary | ICD-10-CM | POA: Diagnosis present

## 2023-10-18 DIAGNOSIS — I951 Orthostatic hypotension: Secondary | ICD-10-CM | POA: Diagnosis not present

## 2023-10-18 DIAGNOSIS — Z8249 Family history of ischemic heart disease and other diseases of the circulatory system: Secondary | ICD-10-CM

## 2023-10-18 DIAGNOSIS — Z79899 Other long term (current) drug therapy: Secondary | ICD-10-CM

## 2023-10-18 DIAGNOSIS — R55 Syncope and collapse: Secondary | ICD-10-CM

## 2023-10-18 DIAGNOSIS — I160 Hypertensive urgency: Secondary | ICD-10-CM | POA: Diagnosis not present

## 2023-10-18 DIAGNOSIS — M349 Systemic sclerosis, unspecified: Secondary | ICD-10-CM | POA: Diagnosis present

## 2023-10-18 DIAGNOSIS — I130 Hypertensive heart and chronic kidney disease with heart failure and stage 1 through stage 4 chronic kidney disease, or unspecified chronic kidney disease: Secondary | ICD-10-CM | POA: Diagnosis present

## 2023-10-18 DIAGNOSIS — Z841 Family history of disorders of kidney and ureter: Secondary | ICD-10-CM

## 2023-10-18 DIAGNOSIS — N179 Acute kidney failure, unspecified: Secondary | ICD-10-CM | POA: Diagnosis not present

## 2023-10-18 DIAGNOSIS — Z90710 Acquired absence of both cervix and uterus: Secondary | ICD-10-CM

## 2023-10-18 DIAGNOSIS — Z8673 Personal history of transient ischemic attack (TIA), and cerebral infarction without residual deficits: Secondary | ICD-10-CM

## 2023-10-18 DIAGNOSIS — I480 Paroxysmal atrial fibrillation: Secondary | ICD-10-CM | POA: Diagnosis present

## 2023-10-18 DIAGNOSIS — Z955 Presence of coronary angioplasty implant and graft: Secondary | ICD-10-CM

## 2023-10-18 DIAGNOSIS — Z87891 Personal history of nicotine dependence: Secondary | ICD-10-CM

## 2023-10-18 DIAGNOSIS — I2510 Atherosclerotic heart disease of native coronary artery without angina pectoris: Secondary | ICD-10-CM | POA: Diagnosis present

## 2023-10-18 DIAGNOSIS — Z8041 Family history of malignant neoplasm of ovary: Secondary | ICD-10-CM

## 2023-10-18 DIAGNOSIS — I5032 Chronic diastolic (congestive) heart failure: Secondary | ICD-10-CM | POA: Diagnosis present

## 2023-10-18 DIAGNOSIS — E785 Hyperlipidemia, unspecified: Secondary | ICD-10-CM | POA: Diagnosis present

## 2023-10-18 DIAGNOSIS — Z7902 Long term (current) use of antithrombotics/antiplatelets: Secondary | ICD-10-CM

## 2023-10-18 LAB — TROPONIN I (HIGH SENSITIVITY)
Troponin I (High Sensitivity): 8 ng/L (ref <18)
Troponin I (High Sensitivity): 18 ng/L — ABNORMAL HIGH (ref <18)

## 2023-10-18 LAB — BASIC METABOLIC PANEL
Anion gap: 10 (ref 5–15)
BUN: 31 mg/dL — ABNORMAL HIGH (ref 8–23)
CO2: 26 mmol/L (ref 22–32)
Calcium: 10.7 mg/dL — ABNORMAL HIGH (ref 8.9–10.3)
Chloride: 105 mmol/L (ref 98–111)
Creatinine, Ser: 1.17 mg/dL — ABNORMAL HIGH (ref 0.44–1.00)
GFR, Estimated: 46 mL/min — ABNORMAL LOW (ref >60)
Glucose, Bld: 115 mg/dL — ABNORMAL HIGH (ref 70–99)
Potassium: 3.7 mmol/L (ref 3.5–5.1)
Sodium: 141 mmol/L (ref 135–145)

## 2023-10-18 LAB — CBC
HCT: 39.5 % (ref 36.0–46.0)
Hemoglobin: 12.8 g/dL (ref 12.0–15.0)
MCH: 28.6 pg (ref 26.0–34.0)
MCHC: 32.4 g/dL (ref 30.0–36.0)
MCV: 88.4 fL (ref 80.0–100.0)
Platelets: 193 10*3/uL (ref 150–400)
RBC: 4.47 MIL/uL (ref 3.87–5.11)
RDW: 14.9 % (ref 11.5–15.5)
WBC: 5.8 10*3/uL (ref 4.0–10.5)
nRBC: 0 % (ref 0.0–0.2)

## 2023-10-18 LAB — D-DIMER, QUANTITATIVE: D-Dimer, Quant: 0.62 ug{FEU}/mL — ABNORMAL HIGH (ref 0.00–0.50)

## 2023-10-18 MED ORDER — ACETAMINOPHEN 500 MG PO TABS
1000.0000 mg | ORAL_TABLET | Freq: Once | ORAL | Status: AC
  Administered 2023-10-18: 1000 mg via ORAL
  Filled 2023-10-18: qty 2

## 2023-10-18 MED ORDER — HYDRALAZINE HCL 20 MG/ML IJ SOLN
5.0000 mg | Freq: Once | INTRAMUSCULAR | Status: AC
  Administered 2023-10-18: 5 mg via INTRAVENOUS
  Filled 2023-10-18: qty 1

## 2023-10-18 MED ORDER — SODIUM CHLORIDE 0.9% FLUSH
3.0000 mL | Freq: Once | INTRAVENOUS | Status: DC
  Administered 2023-10-18: 3 mL via INTRAVENOUS

## 2023-10-18 NOTE — ED Triage Notes (Signed)
PT to triage c/o hypertension with headache x 4 days with systolic >200. Pt reports having Hx of TIA in past when BP was elevated. Pt denies any unilateral deficits, Neuro exam unremarkable. Pt denies CP SOB. BP in triage 216/86. Pt on room air.

## 2023-10-18 NOTE — ED Provider Notes (Signed)
[place] EMERGENCY DEPARTMENT AT [facility] Provider Note   CSN: [id number] Arrival date & time: 10/18/23  2012     History {Add pertinent medical, surgical, social history, OB history to HPI:1} Chief Complaint  Patient presents with  . Hypertension    Kathy George is a 82 y.o. female with PMH as listed below who presents with ***.  Reports that she has been undergoing treatment changes with her blood pressure for years. Has had changes to her BP regimen and then subsequently "bottoms out" and faints; then has it  decreased again and her BP goes too high.  Has had very slight chest pain tonight; denies nausea/vomiting but endorses diarrhea this afternoon, <5 bowel movements, soft/loose but not watery, no hematochezia/melena. No f/c, cough, SOB, recent illnesses. Had a mild frontal headache today that improved with tylenol. Denies stroke-like symptoms including numbness/tingling, asymmetric weakness, vision changes, slurred speech, confusion. Hasn't missed any doses of BP meds. Has noted her BP has been elevated for the last few days. Normally takes 25 mg BID of losartan, but took 50 mg this AM and 25 more mg at noon. Has been prescribed propranolol 10 mg BID PRN for elevated BP but she states it doesn't work.    Past Medical History:  Diagnosis Date  . Anemia   . Anxiety   . CAD (coronary artery disease)    successful IVUs guided PCI of the lesion in the  proximal LAD using a promus drug-eluting stent with improvement in Fentanyl narrowing from 80% to 0%  . Depression   . Dyslipidemia   . GERD (gastroesophageal reflux disease)   . H/O: hysterectomy   . Hiatal hernia   . Hypertension   . Paroxysmal atrial fibrillation (HCC)   . Raynaud's disease   . Scleroderma (HCC)   . SUPRAVENTRICULAR TACHYCARDIA   . Supraventricular tachycardia (HCC)        Home Medications Prior to Admission medications   Medication Sig Start Date End Date Taking? Authorizing Provider   Cholecalciferol (VITAMIN D3) 2000 UNITS capsule Take 2,000 Units by mouth daily. 1 tablet once a day 09/11/11   [provider]  clopidogrel (PLAVIX) 75 MG tablet TAKE 1 TABLET (75 MG TOTAL) BY MOUTH DAILY. Patient taking differently: Take 75 mg by mouth daily. 12/07/15   Wendall Stade, MD  losartan (COZAAR) 50 MG tablet Take 1 tablet (50 mg total) by mouth daily. 07/17/23   Lonell Grandchild, MD  nitroGLYCERIN (NITROSTAT) 0.4 MG SL tablet Place 1 tablet (0.4 mg total) under the tongue every 5 (five) minutes as needed. 06/14/20   Wendall Stade, MD  pantoprazole (PROTONIX) 40 MG tablet Take 80 mg by mouth daily. 08/15/18   [provider]  rosuvastatin (CRESTOR) 10 MG tablet Take 10 mg by mouth daily. 03/15/19   [provider]      Allergies    Patient has no known allergies.    Review of Systems   Review of Systems A 10 point review of systems was performed and is negative unless otherwise reported in HPI.  Physical Exam Updated Vital Signs BP (!) 216/86   Pulse 61   Temp 98 F (36.7 C)   Resp 18   Wt 61.2 kg   SpO2 95%   BMI 23.53 kg/m  Physical Exam General: Normal appearing {Desc; female/female:11659}, lying in bed.  HEENT: PERRLA, Sclera anicteric, MMM, trachea midline.  Cardiology: RRR, no murmurs/rubs/gallops. BL radial and DP pulses equal bilaterally.  Resp: Normal respiratory  rate and effort. CTAB, no wheezes, rhonchi, crackles.  Abd: Soft, non-tender, non-distended. No rebound tenderness or guarding.  GU: Deferred. MSK: No peripheral edema or signs of trauma. Extremities without deformity or TTP. No cyanosis or clubbing. Skin: warm, dry. No rashes or lesions. Back: No CVA tenderness Neuro: A&Ox4, CNs II-XII grossly intact. MAEs. Sensation grossly intact.  Psych: Normal mood and affect.   ED Results / Procedures / Treatments   Labs (all labs ordered are listed, but only abnormal results are displayed) Labs Reviewed  CBC  BASIC METABOLIC  PANEL  TROPONIN I (HIGH SENSITIVITY)    EKG None  Radiology No results found.  Procedures Procedures  {Document cardiac monitor, telemetry assessment procedure when appropriate:1}  Medications Ordered in ED Medications  sodium chloride flush (NS) 0.9 % injection 3 mL (has no administration in time range)    ED Course/ Medical Decision Making/ A&P                          Medical Decision Making Amount and/or Complexity of Data Reviewed Labs: ordered. Decision-making details documented in ED Course. Radiology: ordered. Decision-making details documented in ED Course.  Risk OTC drugs. Prescription drug management.    This patient presents to the ED for concern of ***, this involves an extensive number of treatment options, and is a complaint that carries with it a high risk of complications and morbidity.  I considered the following differential and admission for this acute, potentially life threatening condition.   MDM:    ***  Clinical Course as of 10/18/23 2231  Sat Oct 18, 2023  2112 CBC wnl [HN]  2112 BP now 174/87 without intervention. [HN]  2142 Basic metabolic panel(!) Mildly elevated Bun, otherwise unremarkable. [HN]  2142 Troponin I (High Sensitivity): 8 neg [HN]  2142 DG Chest Port 1 View wnl [HN]    Clinical Course User Index [HN] Loetta Rough, MD    Labs: I Ordered, and personally interpreted labs.  The pertinent results include:  ***  Imaging Studies ordered: I ordered imaging studies including *** I independently visualized and interpreted imaging. I agree with the radiologist interpretation  Additional history obtained from ***.  External records from outside source obtained and reviewed including ***  Cardiac Monitoring: .The patient was maintained on a cardiac monitor.  I personally viewed and interpreted the cardiac monitored which showed an underlying rhythm of: ***  Reevaluation: After the interventions noted above, I  reevaluated the patient and found that they have :{resolved/improved/worsened:23923::"improved"}  Social Determinants of Health: .***  Disposition:  ***  Co morbidities that complicate the patient evaluation . Past Medical History:  Diagnosis Date  . Anemia   . Anxiety   . CAD (coronary artery disease)    successful IVUs guided PCI of the lesion in the  proximal LAD using a promus drug-eluting stent with improvement in Fentanyl narrowing from 80% to 0%  . Depression   . Dyslipidemia   . GERD (gastroesophageal reflux disease)   . H/O: hysterectomy   . Hiatal hernia   . Hypertension   . Paroxysmal atrial fibrillation (HCC)   . Raynaud's disease   . Scleroderma (HCC)   . SUPRAVENTRICULAR TACHYCARDIA   . Supraventricular tachycardia (HCC)      Medicines Meds ordered this encounter  Medications  . sodium chloride flush (NS) 0.9 % injection 3 mL    I have reviewed the patients home medicines and have made adjustments as needed  Problem List / ED Course: Problem List Items Addressed This Visit   None        {Document critical care time when appropriate:1} {Document review of labs and clinical decision tools ie heart score, Chads2Vasc2 etc:1}  {Document your independent review of radiology images, and any outside records:1} {Document your discussion with family members, caretakers, and with consultants:1} {Document social determinants of health affecting pt's care:1} {Document your decision making why or why not admission, treatments were needed:1}  This note was created using dictation software, which may contain spelling or grammatical errors.

## 2023-10-19 ENCOUNTER — Other Ambulatory Visit: Payer: Self-pay

## 2023-10-19 ENCOUNTER — Encounter (HOSPITAL_COMMUNITY): Payer: Medicare HMO

## 2023-10-19 ENCOUNTER — Observation Stay (HOSPITAL_COMMUNITY): Payer: Medicare HMO

## 2023-10-19 DIAGNOSIS — I130 Hypertensive heart and chronic kidney disease with heart failure and stage 1 through stage 4 chronic kidney disease, or unspecified chronic kidney disease: Secondary | ICD-10-CM | POA: Diagnosis not present

## 2023-10-19 DIAGNOSIS — N1832 Chronic kidney disease, stage 3b: Secondary | ICD-10-CM | POA: Diagnosis not present

## 2023-10-19 DIAGNOSIS — I169 Hypertensive crisis, unspecified: Secondary | ICD-10-CM | POA: Diagnosis not present

## 2023-10-19 DIAGNOSIS — Z90710 Acquired absence of both cervix and uterus: Secondary | ICD-10-CM | POA: Diagnosis not present

## 2023-10-19 DIAGNOSIS — Z955 Presence of coronary angioplasty implant and graft: Secondary | ICD-10-CM | POA: Diagnosis not present

## 2023-10-19 DIAGNOSIS — Z8673 Personal history of transient ischemic attack (TIA), and cerebral infarction without residual deficits: Secondary | ICD-10-CM | POA: Diagnosis not present

## 2023-10-19 DIAGNOSIS — I951 Orthostatic hypotension: Secondary | ICD-10-CM | POA: Diagnosis not present

## 2023-10-19 DIAGNOSIS — I7300 Raynaud's syndrome without gangrene: Secondary | ICD-10-CM | POA: Diagnosis not present

## 2023-10-19 DIAGNOSIS — M349 Systemic sclerosis, unspecified: Secondary | ICD-10-CM | POA: Diagnosis not present

## 2023-10-19 DIAGNOSIS — Z79899 Other long term (current) drug therapy: Secondary | ICD-10-CM | POA: Diagnosis not present

## 2023-10-19 DIAGNOSIS — K219 Gastro-esophageal reflux disease without esophagitis: Secondary | ICD-10-CM | POA: Diagnosis not present

## 2023-10-19 DIAGNOSIS — I480 Paroxysmal atrial fibrillation: Secondary | ICD-10-CM | POA: Diagnosis not present

## 2023-10-19 DIAGNOSIS — Z8041 Family history of malignant neoplasm of ovary: Secondary | ICD-10-CM | POA: Diagnosis not present

## 2023-10-19 DIAGNOSIS — Z7902 Long term (current) use of antithrombotics/antiplatelets: Secondary | ICD-10-CM | POA: Diagnosis not present

## 2023-10-19 DIAGNOSIS — E785 Hyperlipidemia, unspecified: Secondary | ICD-10-CM | POA: Diagnosis not present

## 2023-10-19 DIAGNOSIS — Z8249 Family history of ischemic heart disease and other diseases of the circulatory system: Secondary | ICD-10-CM | POA: Diagnosis not present

## 2023-10-19 DIAGNOSIS — Z87891 Personal history of nicotine dependence: Secondary | ICD-10-CM | POA: Diagnosis not present

## 2023-10-19 DIAGNOSIS — I160 Hypertensive urgency: Secondary | ICD-10-CM | POA: Diagnosis not present

## 2023-10-19 DIAGNOSIS — R55 Syncope and collapse: Secondary | ICD-10-CM | POA: Diagnosis present

## 2023-10-19 DIAGNOSIS — Z841 Family history of disorders of kidney and ureter: Secondary | ICD-10-CM | POA: Diagnosis not present

## 2023-10-19 DIAGNOSIS — I2510 Atherosclerotic heart disease of native coronary artery without angina pectoris: Secondary | ICD-10-CM | POA: Diagnosis not present

## 2023-10-19 DIAGNOSIS — I5032 Chronic diastolic (congestive) heart failure: Secondary | ICD-10-CM | POA: Diagnosis not present

## 2023-10-19 DIAGNOSIS — N179 Acute kidney failure, unspecified: Secondary | ICD-10-CM | POA: Diagnosis not present

## 2023-10-19 LAB — CBC
HCT: 39.6 % (ref 36.0–46.0)
Hemoglobin: 13.1 g/dL (ref 12.0–15.0)
MCH: 28.2 pg (ref 26.0–34.0)
MCHC: 33.1 g/dL (ref 30.0–36.0)
MCV: 85.3 fL (ref 80.0–100.0)
Platelets: 185 10*3/uL (ref 150–400)
RBC: 4.64 MIL/uL (ref 3.87–5.11)
RDW: 14.8 % (ref 11.5–15.5)
WBC: 5.5 10*3/uL (ref 4.0–10.5)
nRBC: 0 % (ref 0.0–0.2)

## 2023-10-19 LAB — TROPONIN I (HIGH SENSITIVITY): Troponin I (High Sensitivity): 16 ng/L (ref <18)

## 2023-10-19 LAB — BASIC METABOLIC PANEL
Anion gap: 11 (ref 5–15)
BUN: 22 mg/dL (ref 8–23)
CO2: 22 mmol/L (ref 22–32)
Calcium: 10 mg/dL (ref 8.9–10.3)
Chloride: 105 mmol/L (ref 98–111)
Creatinine, Ser: 0.96 mg/dL (ref 0.44–1.00)
GFR, Estimated: 58 mL/min — ABNORMAL LOW (ref >60)
Glucose, Bld: 104 mg/dL — ABNORMAL HIGH (ref 70–99)
Potassium: 3.4 mmol/L — ABNORMAL LOW (ref 3.5–5.1)
Sodium: 138 mmol/L (ref 135–145)

## 2023-10-19 LAB — CORTISOL: Cortisol, Plasma: 11.4 ug/dL

## 2023-10-19 LAB — URINALYSIS, ROUTINE W REFLEX MICROSCOPIC
Bilirubin Urine: NEGATIVE
Glucose, UA: NEGATIVE mg/dL
Hgb urine dipstick: NEGATIVE
Ketones, ur: NEGATIVE mg/dL
Leukocytes,Ua: NEGATIVE
Nitrite: NEGATIVE
Protein, ur: NEGATIVE mg/dL
Specific Gravity, Urine: 1.009 (ref 1.005–1.030)
pH: 6.5 (ref 5.0–8.0)

## 2023-10-19 LAB — TSH: TSH: 8.577 u[IU]/mL — ABNORMAL HIGH (ref 0.350–4.500)

## 2023-10-19 LAB — PHOSPHORUS: Phosphorus: 2.9 mg/dL (ref 2.5–4.6)

## 2023-10-19 LAB — MAGNESIUM: Magnesium: 1.9 mg/dL (ref 1.7–2.4)

## 2023-10-19 MED ORDER — ROSUVASTATIN CALCIUM 5 MG PO TABS: 10.0000 mg | ORAL_TABLET | Freq: Every day | ORAL | Status: DC

## 2023-10-19 MED ORDER — BUTALBITAL-APAP-CAFFEINE 50-325-40 MG PO TABS
1.0000 | ORAL_TABLET | Freq: Four times a day (QID) | ORAL | Status: DC | PRN
  Administered 2023-10-19: 1 via ORAL
  Filled 2023-10-19: qty 1

## 2023-10-19 MED ORDER — DIPHENHYDRAMINE HCL 12.5 MG/5ML PO ELIX
12.5000 mg | ORAL_SOLUTION | Freq: Once | ORAL | Status: AC
  Administered 2023-10-19: 12.5 mg via ORAL
  Filled 2023-10-19: qty 5

## 2023-10-19 MED ORDER — METOCLOPRAMIDE HCL 5 MG/ML IJ SOLN
5.0000 mg | INTRAMUSCULAR | Status: AC
  Administered 2023-10-19: 5 mg via INTRAVENOUS
  Filled 2023-10-19: qty 2

## 2023-10-19 MED ORDER — ACETAMINOPHEN 325 MG PO TABS: 650.0000 mg | ORAL_TABLET | Freq: Four times a day (QID) | ORAL | Status: DC | PRN

## 2023-10-19 MED ORDER — LOSARTAN POTASSIUM 50 MG PO TABS: 50.0000 mg | ORAL_TABLET | Freq: Every day | ORAL | Status: DC

## 2023-10-19 MED ORDER — LACTATED RINGERS IV BOLUS
500.0000 mL | Freq: Once | INTRAVENOUS | Status: AC
  Administered 2023-10-19: 500 mL via INTRAVENOUS

## 2023-10-19 MED ORDER — HYDRALAZINE HCL 20 MG/ML IJ SOLN
5.0000 mg | Freq: Four times a day (QID) | INTRAMUSCULAR | Status: DC | PRN
  Administered 2023-10-19: 5 mg via INTRAVENOUS
  Filled 2023-10-19: qty 1

## 2023-10-19 MED ORDER — ENOXAPARIN SODIUM 30 MG/0.3ML IJ SOSY
30.0000 mg | PREFILLED_SYRINGE | INTRAMUSCULAR | Status: DC
  Administered 2023-10-19 – 2023-10-21 (×3): 30 mg via SUBCUTANEOUS
  Filled 2023-10-19 (×3): qty 0.3

## 2023-10-19 MED ORDER — PROCHLORPERAZINE EDISYLATE 10 MG/2ML IJ SOLN: 5.0000 mg | Freq: Four times a day (QID) | INTRAMUSCULAR | Status: DC | PRN

## 2023-10-19 MED ORDER — LACTATED RINGERS IV SOLN: INTRAVENOUS | Status: AC

## 2023-10-19 MED ORDER — VITAMIN D 25 MCG (1000 UNIT) PO TABS
2000.0000 [IU] | ORAL_TABLET | Freq: Every day | ORAL | Status: DC
  Administered 2023-10-19 – 2023-10-21 (×3): 2000 [IU] via ORAL
  Filled 2023-10-19 (×3): qty 2

## 2023-10-19 MED ORDER — LOSARTAN POTASSIUM 50 MG PO TABS
50.0000 mg | ORAL_TABLET | Freq: Every day | ORAL | Status: DC
  Administered 2023-10-19: 50 mg via ORAL
  Filled 2023-10-19: qty 1

## 2023-10-19 MED ORDER — IBUPROFEN 200 MG PO TABS
600.0000 mg | ORAL_TABLET | Freq: Once | ORAL | Status: AC
  Administered 2023-10-19: 600 mg via ORAL
  Filled 2023-10-19: qty 1

## 2023-10-19 MED ORDER — POLYETHYLENE GLYCOL 3350 17 G PO PACK: 17.0000 g | PACK | Freq: Every day | ORAL | Status: DC | PRN

## 2023-10-19 MED ORDER — PROCHLORPERAZINE EDISYLATE 10 MG/2ML IJ SOLN
5.0000 mg | Freq: Once | INTRAMUSCULAR | Status: AC
  Administered 2023-10-19: 5 mg via INTRAVENOUS
  Filled 2023-10-19: qty 2

## 2023-10-19 MED ORDER — KETOROLAC TROMETHAMINE 15 MG/ML IJ SOLN
15.0000 mg | INTRAMUSCULAR | Status: AC
  Administered 2023-10-19: 15 mg via INTRAVENOUS
  Filled 2023-10-19: qty 1

## 2023-10-19 MED ORDER — PANTOPRAZOLE SODIUM 40 MG PO TBEC
80.0000 mg | DELAYED_RELEASE_TABLET | Freq: Every day | ORAL | Status: DC
  Administered 2023-10-19 – 2023-10-21 (×3): 80 mg via ORAL
  Filled 2023-10-19 (×3): qty 2

## 2023-10-19 MED ORDER — MELATONIN 5 MG PO TABS: 5.0000 mg | ORAL_TABLET | Freq: Every evening | ORAL | Status: DC | PRN

## 2023-10-19 MED ORDER — DIPHENHYDRAMINE HCL 50 MG/ML IJ SOLN
12.5000 mg | INTRAMUSCULAR | Status: AC
  Administered 2023-10-19: 12.5 mg via INTRAVENOUS
  Filled 2023-10-19: qty 1

## 2023-10-19 MED ORDER — SODIUM CHLORIDE 0.9 % IV BOLUS
500.0000 mL | Freq: Once | INTRAVENOUS | Status: AC
  Administered 2023-10-19: 500 mL via INTRAVENOUS

## 2023-10-19 MED ORDER — POTASSIUM CHLORIDE CRYS ER 20 MEQ PO TBCR
40.0000 meq | EXTENDED_RELEASE_TABLET | ORAL | Status: AC
  Administered 2023-10-19 (×2): 40 meq via ORAL
  Filled 2023-10-19 (×2): qty 2

## 2023-10-19 NOTE — Progress Notes (Signed)
PT Cancellation Note  Patient Details Name: Kathy George MRN: [id number] DOB: November 21, 1940   Cancelled Treatment:    Reason Eval/Treat Not Completed: PT screened - spoke with pt who reports no concerns regarding mobility, has been getting around room without concerns; her only complaint is a headache. No needs identified; acute PT will sign off.  Ina Homes, PT, DPT Acute Rehabilitation Services  Personal: [facility] Office: [phone]  Malachy Chamber 10/19/2023, 8:28 AM

## 2023-10-19 NOTE — Evaluation (Signed)
Occupational Therapy Evaluation Patient Details Name: Kathy George MRN: [id number] DOB: 04/13/1941 Today's Date: 10/19/2023   History of Present Illness 82 y.o. female admitted 10/18/23 with HTN, severe headache. Workup for hypertensive crisis, associated demand ischemia. PMH includes CAD, CKD, HTN, CHF, PAF, SVT, scleroderma, depression, anxiety.   Clinical Impression   PTA, pt was living with her daughter (who is an Charity fundraiser) and was independent. Currently, pt presenting with orthostatic hypotension impacting her occupational participation. Pt performing ADLs and functional mobility at Supervision-Independent level; supervision for safety as patient symptomatic.  See general comments for BP; lowest BP 64/28. BP 102/39 at end of session and MD present. Pt would benefit from further acute OT to facilitate safe dc. Recommend dc to home once medically stable per physician.       If plan is discharge home, recommend the following:      Functional Status Assessment  Patient has not had a recent decline in their functional status  Equipment Recommendations  None recommended by OT    Recommendations for Other Services       Precautions / Restrictions Precautions Precautions: None      Mobility Bed Mobility Overal bed mobility: Independent                  Transfers Overall transfer level: Independent                        Balance Overall balance assessment: No apparent balance deficits (not formally assessed)                                         ADL either performed or assessed with clinical judgement   ADL Overall ADL's : Needs assistance/impaired                                       General ADL Comments: Supervision level due to fatigue and orthostatic BP     Vision Baseline Vision/History: 1 Wears glasses       Perception         Praxis         Pertinent Vitals/Pain Pain Assessment Pain Assessment:  0-10 Pain Score: 6  Pain Location: HA Pain Descriptors / Indicators: Headache Pain Intervention(s): Monitored during session     Extremity/Trunk Assessment Upper Extremity Assessment Upper Extremity Assessment: Overall WFL for tasks assessed   Lower Extremity Assessment Lower Extremity Assessment: Overall WFL for tasks assessed   Cervical / Trunk Assessment Cervical / Trunk Assessment: Normal   Communication Communication Communication: No apparent difficulties   Cognition Arousal: Alert Behavior During Therapy: WFL for tasks assessed/performed Overall Cognitive Status: Within Functional Limits for tasks assessed                                       General Comments  Taking orthostatic BPs. Pt symptomatic and feeling she is going to pass out with movement. BP upon arrival and supine 107/49 (65). Orthostatic BP: supine 99/48, sitting 82/45, standing 86/57, and supine 64/28 (sympomtic). Placing patient in trendelenburg and notifying RN. Supine at end of session and BP 102/39. Dr Lowell Guitar present at end of session.    Exercises     Shoulder  Instructions      Home Living Family/patient expects to be discharged to:: Private residence Living Arrangements: Children Available Help at Discharge: Family;Available PRN/intermittently Type of Home: Other(Comment) Home Access: Stairs to enter Entrance Stairs-Number of Steps: 3-4   Home Layout: Two level   Alternate Level Stairs-Rails: Right Bathroom Shower/Tub: Chief Strategy Officer: Handicapped height     Home Equipment: Grab bars - tub/shower          Prior Functioning/Environment Prior Level of Function : Independent/Modified Independent;Driving               ADLs Comments: Enjoys reading and traveling        OT Problem List: Impaired balance (sitting and/or standing);Decreased knowledge of use of DME or AE;Decreased knowledge of precautions      OT Treatment/Interventions:  Self-care/ADL training;Therapeutic exercise;Energy conservation;DME and/or AE instruction;Therapeutic activities;Patient/family education    OT Goals(Current goals can be found in the care plan section) Acute Rehab OT Goals Patient Stated Goal: Feel better and go home OT Goal Formulation: With patient Time For Goal Achievement: 11/02/23 Potential to Achieve Goals: Good  OT Frequency: Min 1X/week    Co-evaluation              AM-PAC OT "6 Clicks" Daily Activity     Outcome Measure Help from another person eating meals?: None Help from another person taking care of personal grooming?: None Help from another person toileting, which includes using toliet, bedpan, or urinal?: A Little Help from another person bathing (including washing, rinsing, drying)?: A Little Help from another person to put on and taking off regular upper body clothing?: None Help from another person to put on and taking off regular lower body clothing?: A Little 6 Click Score: 21   End of Session Nurse Communication: Mobility status (BP)  Activity Tolerance: Treatment limited secondary to medical complications (Comment) Patient left: in bed;with call bell/phone within reach  OT Visit Diagnosis: Unsteadiness on feet (R26.81);Other abnormalities of gait and mobility (R26.89);Muscle weakness (generalized) (M62.81)                Time: 4696-2952 OT Time Calculation (min): 42 min Charges:  OT General Charges $OT Visit: 1 Visit OT Evaluation $OT Eval Low Complexity: 1 Low OT Treatments $Self Care/Home Management : 23-37 mins  Kathy George MSOT, OTR/L Acute Rehab Office: [phone]  Theodoro Grist Deshea Pooley 10/19/2023, 10:24 AM

## 2023-10-19 NOTE — Progress Notes (Signed)
Pt arrived from [facility] on stretcher with reports of 10/10 headache, no other complaints.  Neuro intact.  Amion admitting paged of patient arrival and communicated headache 10/10 to provider.  Dr. Margo Aye currently at bedside with patient.  Will administer medication ordered and continue to monitor patient closely.

## 2023-10-19 NOTE — Progress Notes (Signed)
   10/19/23 0921  Assess: MEWS Score  BP (!) 64/28  MAP (mmHg) (!) 40  ECG Heart Rate 80  Assess: MEWS Score  MEWS Temp 0  MEWS Systolic 3  MEWS Pulse 0  MEWS RR 0  MEWS LOC 0  MEWS Score 3  MEWS Score Color Yellow  Assess: if the MEWS score is Yellow or Red  Were vital signs accurate and taken at a resting state? No, vital signs rechecked  Does the patient meet 2 or more of the SIRS criteria? No  MEWS guidelines implemented  Yes, yellow  Treat  MEWS Interventions Considered administering scheduled or prn medications/treatments as ordered  Take Vital Signs  Increase Vital Sign Frequency  Yellow: Q2hr x1, continue Q4hrs until patient remains green for 12hrs  Escalate  MEWS: Escalate Yellow: Discuss with charge nurse and consider notifying provider and/or RRT  Notify: Charge Nurse/RN  Name of Charge Nurse/RN Notified Sarah  Provider Notification  Provider Name/Title A caldwell powell,jr  Date Provider Notified 10/19/23  Time Provider Notified [phone]  Method of Notification Page  Notification Reason Critical Result  Provider response See new orders  Date of Provider Response 10/19/23  Time of Provider Response 0947  Assess: SIRS CRITERIA  SIRS Temperature  0  SIRS Respirations  0  SIRS Pulse 0  SIRS WBC 0  SIRS Score Sum  0

## 2023-10-19 NOTE — Progress Notes (Signed)
PROGRESS NOTE    Kathy George  UEA:[id number] DOB: [birth year] DOA: 10/18/2023 PCP: Tracey Harries, MD  Chief Complaint  Patient presents with   Hypertension    Brief Narrative:   Kathy George is [place] 82 y.o. female with medical history significant for hypertension, coronary artery disease status post PCI with drug-eluting stent of the proximal LAD, hyperlipidemia (intolerant of statin), history of presyncope related to Coreg, CKD 3B, who initially presented to [facility] ED with complaints of high blood pressures greater than 160 systolically for the past 3 days despite taking extra doses of her home losartan.    Assessment & Plan:   Principal Problem:   Hypertensive crisis  Hypertensive Crisis  Orthostatic Hypotension  Presyncope Labile Blood Pressure  Initially presented to ED with elevated blood pressure alone (it was after she decided to present that she developed severe HA) Today, severe orthostasis (SBP in 60's) when getting up to bedside commode with OT She notes chronic issues with hypertension and relative hypotension  HA and flushing commonly when her BP is elevated She's on losartan alone as her only blood pressure medicine (previously didn't tolerate coreg with presyncope) - she received this this morning.  She did receive hydralazine this AM which could have contributed to this hypotensive episode she had.  She'd also taken some extra doses of losartan, we may be seeing this effect? Will workup secondary causes of hypertension: update Kathy George TSH, cortisol, renin/aldo, renal doppler, echo.  With spells of flushing, hypertension -> reasonable to workup for pheo with 24 hr urine catecholamines/metanephrines.  Hold lostartan for now with severe orthostatic hypotension, bolus and follow  PT/OT, daily orthostatics, fall precaution  Headache CT without acute finding Will monitor, treat as needed with limited course of fioricet (if persistent, headache cocktail)  Elevated troponin,  suspect demand ischemia in the setting of hypertensive crisis Troponin 8->18->16 - not c/w ACS, suspect demand related to severely elevated BP Follow 2D echo Monitor on telemetry.   Coronary disease status post PCI with stent Resume home antiplatelet, Plavix, if noncontrast CT head is nonacute, with no evidence of intracranial hemorrhage The patient is not on antilipid, did not tolerate in the past. Denies any anginal symptoms at the time of this visit. Continue to monitor on telemetry   CKD 3B Renal function is at baseline   Chronic HFpEF Last 2D echo done on 07/15/2014 revealed LVEF 55 to 60% Euvolemic on exam Repeating echo today    DVT prophylaxis: lovenox Code Status: full Family Communication: none Disposition:   Status is: Observation The patient remains OBS appropriate and will d/c before 2 midnights.   Consultants:  cardiology  Procedures:  Pending echo   Antimicrobials:  Anti-infectives (From admission, onward)    None       Subjective: Worried about her low blood pressure  Objective: Vitals:   10/19/23 0925 10/19/23 0931 10/19/23 0935 10/19/23 0942  BP: (!) 85/35 (!) 91/35 (!) 102/39 (!) 106/38  Pulse:  86 85 94  Resp:      Temp:      TempSrc:      SpO2:  99% 100% 100%  Weight:      Height:        Intake/Output Summary (Last 24 hours) at 10/19/2023 0959 Last data filed at 10/19/2023 0800 Gross per 24 hour  Intake 209.56 ml  Output --  Net 209.56 ml   Filed Weights   10/18/23 2033 10/19/23 0300  Weight: 61.2 kg 62.3 kg    Examination:  General exam: Appears calm and comfortable  Respiratory system: unlabored Cardiovascular system: RRR Gastrointestinal system: Abdomen is nondistended, soft and nontender.  Central nervous system: Alert and oriented. No focal neurological deficits. Extremities: no LEE   Data Reviewed: I have personally reviewed following labs and imaging studies  CBC: Recent Labs  Lab 10/18/23 2047  10/19/23 0516  WBC 5.8 5.5  HGB 12.8 13.1  HCT 39.5 39.6  MCV 88.4 85.3  PLT 193 185    Basic Metabolic Panel: Recent Labs  Lab 10/18/23 2047 10/19/23 0516  NA 141 138  K 3.7 3.4*  CL 105 105  CO2 26 22  GLUCOSE 115* 104*  BUN 31* 22  CREATININE 1.17* 0.96  CALCIUM 10.7* 10.0  MG  --  1.9  PHOS  --  2.9    GFR: Estimated Creatinine Clearance: 36.1 mL/min (by C-G formula based on SCr of 0.96 mg/dL).  Liver Function Tests: No results for input(s): "AST", "ALT", "ALKPHOS", "BILITOT", "PROT", "ALBUMIN" in the last 168 hours.  CBG: No results for input(s): "GLUCAP" in the last 168 hours.   No results found for this or any previous visit (from the past 240 hours).       Radiology Studies: CT HEAD WO CONTRAST ( ) Result Date: 10/19/2023 CLINICAL DATA:  82 year old female with sudden severe headache. EXAM: CT HEAD WITHOUT CONTRAST TECHNIQUE: Contiguous axial images were obtained from the base of the skull through the vertex without intravenous contrast. RADIATION DOSE REDUCTION: This exam was performed according to the departmental dose-optimization program which includes automated exposure control, adjustment of the mA and/or kV according to patient size and/or use of iterative reconstruction technique. COMPARISON:  Brain MRI 08/04/2019.  Head CT 07/17/2023. FINDINGS: Brain: Stable cerebral volume. No midline shift, ventriculomegaly, mass effect, evidence of mass lesion, intracranial hemorrhage or evidence of cortically based acute infarction. Patchy bilateral white matter hypodensity most pronounced in the anterior frontal lobes, stable gray-white matter differentiation throughout the brain. Vascular: No suspicious intracranial vascular hyperdensity. Calcified atherosclerosis at the skull base. Skull: Intact, negative.  Torus palatinus normal variant. Sinuses/Orbits: Visualized paranasal sinuses and mastoids are clear. Other: No acute orbit or scalp soft tissue finding.  IMPRESSION: No acute intracranial abnormality. Stable non contrast CT appearance of the brain. Electronically Signed   By: Odessa Fleming M.D.   On: 10/19/2023 06:49   DG Chest Port 1 View Result Date: 10/18/2023 CLINICAL DATA:  Hypertension headache EXAM: PORTABLE CHEST 1 VIEW COMPARISON:  06/01/2023 FINDINGS: The heart size and mediastinal contours are within normal limits. Both lungs are clear. The visualized skeletal structures are unremarkable. Calcified breast implants. Aortic atherosclerosis. IMPRESSION: No active disease. Electronically Signed   By: Jasmine Pang M.D.   On: 10/18/2023 21:12        Scheduled Meds:  cholecalciferol  2,000 Units Oral Daily   enoxaparin (LOVENOX) injection  30 mg Subcutaneous Q24H   pantoprazole  80 mg Oral Daily   potassium chloride  40 mEq Oral Q4H   Continuous Infusions:  lactated ringers     lactated ringers 50 mL/hr at 10/19/23 0440     LOS: 0 days    Time spent: over 30 min    Lacretia Nicks, MD [facility]   To contact the attending provider between 7A-7P or the covering provider during after hours 7P-7A, please log into the web site [email or web address] and access using universal [place] password for that web site. If you do not have the password, please call the hospital operator.  10/19/2023, 9:59 AM

## 2023-10-19 NOTE — H&P (Signed)
History and Physical  Kathy George VZD:[id number] DOB: 1941/08/07 DOA: 10/18/2023  Referring physician: Admitted by Dr. Antionette Char [facility], hospitalist service. PCP: Tracey Harries, MD  Outpatient Specialists: Cardiology. Patient coming from: Home through [facility] ED.  Chief Complaint: High blood pressures in the past 3 days and severe headache.  HPI: Kathy George is a 82 y.o. female with medical history significant for hypertension, coronary artery disease status post PCI with drug-eluting stent of the proximal LAD, hyperlipidemia (intolerant of statin), history of presyncope related to Coreg, CKD 3B, who initially presented to [facility] ED with complaints of high blood pressures greater than 160 systolically for the past 3 days despite taking extra doses of her home losartan.    On the day of presentation, her blood pressure was greater than 210 and she developed a severe headache, mainly frontal.  Associated with lightheadedness.  The patient decided to go to the ED for further evaluation.  She checks her blood pressure daily.  No prior history of migraines.  No head trauma.  Denies any focal unilateral motor or sensory deficits.  Denies any chest pain or shortness of breath.  Upon arrival to the ED, SBP greater than 210.  The patient received IV hydralazine with improvement.  UA was negative for pyuria.  EDP requested admission for further evaluation.  Admitted by [facility], hospitalist service, accepted by Dr. Antionette Char and transferred to [facility] progressive care unit as observation status.  ED Course: Temperature 97.4.  BP 172/64, pulse 64, respiration rate 16, saturation 98% on room air.  Lab studies notable for BUN 31, creatinine 1.17, calcium 10.7.  GFR 46.  Review of Systems: Review of systems as noted in the HPI. All other systems reviewed and are negative.   Past Medical History:  Diagnosis Date   Anemia    Anxiety    CAD (coronary artery disease)    successful IVUs guided PCI of  the lesion in the  proximal LAD using a promus drug-eluting stent with improvement in Fentanyl narrowing from 80% to 0%   Depression    Dyslipidemia    GERD (gastroesophageal reflux disease)    H/O: hysterectomy    Hiatal hernia    Hypertension    Paroxysmal atrial fibrillation (HCC)    Raynaud's disease    Scleroderma (HCC)    SUPRAVENTRICULAR TACHYCARDIA    Supraventricular tachycardia (HCC)    Past Surgical History:  Procedure Laterality Date   BREAST ENHANCEMENT SURGERY     right rotator cuff repair     TOTAL ABDOMINAL HYSTERECTOMY      Social History:  reports that she quit smoking about 62 years ago. Her smoking use included cigarettes. She started smoking about 63 years ago. She has a 0.1 pack-year smoking history. She has never used smokeless tobacco. She reports current alcohol use of about 2.0 - 3.0 standard drinks of alcohol per week. She reports that she does not use drugs.   No Known Allergies  Family History  Problem Relation Age of Onset   Kidney disease Mother        chronic; intracranial hemorrhage   Coronary artery disease Other        grandfather   Hyperlipidemia Daughter    Rheum arthritis Maternal Aunt    Ovarian cancer Maternal Grandmother       Prior to Admission medications   Medication Sig Start Date End Date Taking? Authorizing Provider  Cholecalciferol (VITAMIN D3) 2000 UNITS capsule Take 2,000 Units by mouth daily. 1 tablet once a  day 09/11/11   [provider]  clopidogrel (PLAVIX) 75 MG tablet TAKE 1 TABLET (75 MG TOTAL) BY MOUTH DAILY. Patient taking differently: Take 75 mg by mouth daily. 12/07/15   Wendall Stade, MD  losartan (COZAAR) 50 MG tablet Take 1 tablet (50 mg total) by mouth daily. 07/17/23   Lonell Grandchild, MD  nitroGLYCERIN (NITROSTAT) 0.4 MG SL tablet Place 1 tablet (0.4 mg total) under the tongue every 5 (five) minutes as needed. 06/14/20   Wendall Stade, MD  pantoprazole (PROTONIX) 40 MG tablet Take 80 mg by mouth  daily. 08/15/18   [provider]  rosuvastatin (CRESTOR) 10 MG tablet Take 10 mg by mouth daily. 03/15/19   [provider]    Physical Exam: BP (!) 172/81 (BP Location: Left Arm)   Pulse 71   Temp (!) 97.4 F (36.3 C) (Oral)   Resp 16   Ht 5\' 3"  (1.6 m)   Wt 62.3 kg   SpO2 100%   BMI 24.34 kg/m   General: 82 y.o. year-old female well developed well nourished in no acute distress.  Alert and oriented x3. Cardiovascular: Regular rate and rhythm with no rubs or gallops.  No thyromegaly or JVD noted.  No lower extremity edema. 2/4 pulses in all 4 extremities. Respiratory: Clear to auscultation with no wheezes or rales. Good inspiratory effort. Abdomen: Soft nontender nondistended with normal bowel sounds x4 quadrants. Muskuloskeletal: No cyanosis, clubbing or edema noted bilaterally Neuro: CN II-XII intact, strength, sensation, reflexes Skin: No ulcerative lesions noted or rashes Psychiatry: Judgement and insight appear normal. Mood is appropriate for condition and setting          Labs on Admission:  Basic Metabolic Panel: Recent Labs  Lab 10/18/23 2047  NA 141  K 3.7  CL 105  CO2 26  GLUCOSE 115*  BUN 31*  CREATININE 1.17*  CALCIUM 10.7*   Liver Function Tests: No results for input(s): "AST", "ALT", "ALKPHOS", "BILITOT", "PROT", "ALBUMIN" in the last 168 hours. No results for input(s): "LIPASE", "AMYLASE" in the last 168 hours. No results for input(s): "AMMONIA" in the last 168 hours. CBC: Recent Labs  Lab 10/18/23 2047  WBC 5.8  HGB 12.8  HCT 39.5  MCV 88.4  PLT 193   Cardiac Enzymes: No results for input(s): "CKTOTAL", "CKMB", "CKMBINDEX", "TROPONINI" in the last 168 hours.  BNP (last 3 results) No results for input(s): "BNP" in the last 8760 hours.  ProBNP (last 3 results) No results for input(s): "PROBNP" in the last 8760 hours.  CBG: No results for input(s): "GLUCAP" in the last 168 hours.  Radiological Exams on Admission: DG  Chest Port 1 View Result Date: 10/18/2023 CLINICAL DATA:  Hypertension headache EXAM: PORTABLE CHEST 1 VIEW COMPARISON:  06/01/2023 FINDINGS: The heart size and mediastinal contours are within normal limits. Both lungs are clear. The visualized skeletal structures are unremarkable. Calcified breast implants. Aortic atherosclerosis. IMPRESSION: No active disease. Electronically Signed   By: Jasmine Pang M.D.   On: 10/18/2023 21:12    EKG: I independently viewed the EKG done and my findings are as followed: Sinus bradycardia rate of 59.  Nonspecific ST-T changes.  QTc 396.  Assessment/Plan Present on Admission:  Hypertensive crisis  Principal Problem:   Hypertensive crisis  Hypertensive crisis, unclear etiology Presented with SBP greater than 210 Resume home oral antihypertensive, losartan Did not tolerate Coreg in the past, was causing presyncope. IV hydralazine as needed with parameters Closely monitor vital signs  Severe  headache, rule out intracranial structural abnormality Denies prior history of migraines or frequent headaches The patient is on Plavix Will obtain a noncontrast CT head prior to restarting home Plavix Headache cocktail As needed analgesics  Elevated troponin, suspect demand ischemia in the setting of hypertensive crisis Initial high-sensitivity troponin 8, repeat 18 Trend troponin until reaches a peak Follow 2D echo Monitor on telemetry.  Coronary disease status post PCI with stent Resume home antiplatelet, Plavix, if noncontrast CT head is nonacute, with no evidence of intracranial hemorrhage The patient is not on antilipid, did not tolerate in the past. Denies any anginal symptoms at the time of this visit. Continue to monitor on telemetry  Presyncope, lightheadedness Obtain orthostatic vital signs every shift x 2 Gentle IV fluid hydration LR at 50 cc/h x 12 hours PT OT assessment Fall precautions  CKD 3B Renal function is at baseline Avoid  nephrotoxic agents, dehydration, and hypotension Gentle IV fluid hydration LR at 50 cc/h x 12 hours Monitor urine output Repeat renal function panel in the morning.  Chronic HFpEF Last 2D echo done on 07/15/2014 revealed LVEF 55 to 60% Euvolemic on exam Closely monitor volume status while on IV fluid Start strict I's and O's and daily weight   Time: 75 minutes.   DVT prophylaxis: Subcu Lovenox daily.  Code Status: Full code  Family Communication: None at bedside.  Disposition Plan: Admitted to progressive care unit.  Consults called: None.  Admission status: Observation status.   Status is: Observation    Darlin Drop MD [facility] Pager [phone]  If 7PM-7AM, please contact night-coverage [email or web address] Password [facility]  10/19/2023, 3:57 AM

## 2023-10-19 NOTE — ED Notes (Signed)
Pt states the HA is better, but is still having lightheadedness. No neuro symptoms present

## 2023-10-19 NOTE — Plan of Care (Signed)

## 2023-10-19 NOTE — Care Management Obs Status (Signed)
MEDICARE OBSERVATION STATUS NOTIFICATION   Patient Details  Name: Kathy George MRN: [id number] Date of Birth: 22-Aug-1941   Medicare Observation Status Notification Given:  Yes    Ronny Bacon, RN 10/19/2023, 3:08 PM

## 2023-10-19 NOTE — ED Notes (Signed)
Report called to [facility] and given to [place], [place]

## 2023-10-19 NOTE — Progress Notes (Signed)
Plan of Care Note for accepted transfer   Patient: Kathy George MRN: [id number]   DOA: 10/18/2023  Facility requesting transfer: [facility]   Requesting Provider: Dr. Jearld Fenton   Reason for transfer: Hypertensive crisis   Facility course: 82 yr old female with hx of HTN, HLD, CKD 3A, CAD, TIA, and scleroderma who presents with labile BP and episodes of near-syncope. She reports elevated BP with mild HA but also reports that BP becomes too when medication adjustments are made.   BP was 216/86 initially. She was given IV hydralazine and BP went as low as 132/62.   Initial troponin was 8 and repeat was 18. EKG abnormalities appear very similar to prior EKG.   Plan of care: The patient is accepted for admission to Progressive unit, at [facility].   Author: Briscoe Deutscher, MD 10/19/2023  Check [email or web address] for on-call coverage.  Nursing staff, Please call [facility] Admits & Consults System-Wide number on Amion as soon as patient's arrival, so appropriate admitting provider can evaluate the pt.

## 2023-10-19 NOTE — Progress Notes (Signed)
Patient requesting her daily keflex 250 mg.  She states she has not taken since Friday and she feels as if she is getting a urinary tract infection.  Pt is afebrile and WBC WNL, currently has a 24 hour urine in progress.  Dr. Antionette Char notified.  No new orders at the present time.  Will continue to monitor for changes and follow up with Dr. Lowell Guitar in am per on call MD recommendation.

## 2023-10-20 ENCOUNTER — Observation Stay (HOSPITAL_COMMUNITY): Payer: Medicare HMO

## 2023-10-20 DIAGNOSIS — I951 Orthostatic hypotension: Secondary | ICD-10-CM | POA: Diagnosis not present

## 2023-10-20 DIAGNOSIS — I2510 Atherosclerotic heart disease of native coronary artery without angina pectoris: Secondary | ICD-10-CM | POA: Diagnosis present

## 2023-10-20 DIAGNOSIS — Z8673 Personal history of transient ischemic attack (TIA), and cerebral infarction without residual deficits: Secondary | ICD-10-CM | POA: Diagnosis not present

## 2023-10-20 DIAGNOSIS — R7989 Other specified abnormal findings of blood chemistry: Secondary | ICD-10-CM | POA: Diagnosis not present

## 2023-10-20 DIAGNOSIS — N1832 Chronic kidney disease, stage 3b: Secondary | ICD-10-CM | POA: Diagnosis present

## 2023-10-20 DIAGNOSIS — I10 Essential (primary) hypertension: Secondary | ICD-10-CM | POA: Diagnosis not present

## 2023-10-20 DIAGNOSIS — Z79899 Other long term (current) drug therapy: Secondary | ICD-10-CM | POA: Diagnosis not present

## 2023-10-20 DIAGNOSIS — I130 Hypertensive heart and chronic kidney disease with heart failure and stage 1 through stage 4 chronic kidney disease, or unspecified chronic kidney disease: Secondary | ICD-10-CM | POA: Diagnosis present

## 2023-10-20 DIAGNOSIS — R55 Syncope and collapse: Secondary | ICD-10-CM | POA: Diagnosis present

## 2023-10-20 DIAGNOSIS — I160 Hypertensive urgency: Secondary | ICD-10-CM

## 2023-10-20 DIAGNOSIS — Z841 Family history of disorders of kidney and ureter: Secondary | ICD-10-CM | POA: Diagnosis not present

## 2023-10-20 DIAGNOSIS — Z8041 Family history of malignant neoplasm of ovary: Secondary | ICD-10-CM | POA: Diagnosis not present

## 2023-10-20 DIAGNOSIS — K219 Gastro-esophageal reflux disease without esophagitis: Secondary | ICD-10-CM | POA: Diagnosis present

## 2023-10-20 DIAGNOSIS — N179 Acute kidney failure, unspecified: Secondary | ICD-10-CM | POA: Diagnosis not present

## 2023-10-20 DIAGNOSIS — I169 Hypertensive crisis, unspecified: Secondary | ICD-10-CM | POA: Diagnosis not present

## 2023-10-20 DIAGNOSIS — Z955 Presence of coronary angioplasty implant and graft: Secondary | ICD-10-CM | POA: Diagnosis not present

## 2023-10-20 DIAGNOSIS — Z87891 Personal history of nicotine dependence: Secondary | ICD-10-CM | POA: Diagnosis not present

## 2023-10-20 DIAGNOSIS — Z8249 Family history of ischemic heart disease and other diseases of the circulatory system: Secondary | ICD-10-CM | POA: Diagnosis not present

## 2023-10-20 DIAGNOSIS — I480 Paroxysmal atrial fibrillation: Secondary | ICD-10-CM | POA: Diagnosis present

## 2023-10-20 DIAGNOSIS — Z7902 Long term (current) use of antithrombotics/antiplatelets: Secondary | ICD-10-CM | POA: Diagnosis not present

## 2023-10-20 DIAGNOSIS — E785 Hyperlipidemia, unspecified: Secondary | ICD-10-CM | POA: Diagnosis present

## 2023-10-20 DIAGNOSIS — I7300 Raynaud's syndrome without gangrene: Secondary | ICD-10-CM | POA: Diagnosis present

## 2023-10-20 DIAGNOSIS — I5032 Chronic diastolic (congestive) heart failure: Secondary | ICD-10-CM | POA: Diagnosis present

## 2023-10-20 DIAGNOSIS — M349 Systemic sclerosis, unspecified: Secondary | ICD-10-CM | POA: Diagnosis present

## 2023-10-20 DIAGNOSIS — Z90710 Acquired absence of both cervix and uterus: Secondary | ICD-10-CM | POA: Diagnosis not present

## 2023-10-20 LAB — CBC WITH DIFFERENTIAL/PLATELET
Abs Immature Granulocytes: 0.01 10*3/uL (ref 0.00–0.07)
Basophils Absolute: 0 10*3/uL (ref 0.0–0.1)
Basophils Relative: 0 %
Eosinophils Absolute: 0.2 10*3/uL (ref 0.0–0.5)
Eosinophils Relative: 3 %
HCT: 36.8 % (ref 36.0–46.0)
Hemoglobin: 12.1 g/dL (ref 12.0–15.0)
Immature Granulocytes: 0 %
Lymphocytes Relative: 25 %
Lymphs Abs: 1.6 10*3/uL (ref 0.7–4.0)
MCH: 28.5 pg (ref 26.0–34.0)
MCHC: 32.9 g/dL (ref 30.0–36.0)
MCV: 86.8 fL (ref 80.0–100.0)
Monocytes Absolute: 0.5 10*3/uL (ref 0.1–1.0)
Monocytes Relative: 8 %
Neutro Abs: 4 10*3/uL (ref 1.7–7.7)
Neutrophils Relative %: 64 %
Platelets: 159 10*3/uL (ref 150–400)
RBC: 4.24 MIL/uL (ref 3.87–5.11)
RDW: 15.1 % (ref 11.5–15.5)
WBC: 6.3 10*3/uL (ref 4.0–10.5)
nRBC: 0 % (ref 0.0–0.2)

## 2023-10-20 LAB — ECHOCARDIOGRAM COMPLETE
AR max vel: 2.91 cm2
AV Area VTI: 2.64 cm2
AV Area mean vel: 2.8 cm2
AV Mean grad: 5 mm[Hg]
AV Peak grad: 8.8 mm[Hg]
Ao pk vel: 1.48 m/s
Area-P 1/2: 3.03 cm2
Height: 63 in
S' Lateral: 2.2 cm
Weight: 2229.29 [oz_av]

## 2023-10-20 LAB — COMPREHENSIVE METABOLIC PANEL
ALT: 10 U/L (ref 0–44)
AST: 17 U/L (ref 15–41)
Albumin: 3.1 g/dL — ABNORMAL LOW (ref 3.5–5.0)
Alkaline Phosphatase: 48 U/L (ref 38–126)
Anion gap: 5 (ref 5–15)
BUN: 24 mg/dL — ABNORMAL HIGH (ref 8–23)
CO2: 24 mmol/L (ref 22–32)
Calcium: 9.5 mg/dL (ref 8.9–10.3)
Chloride: 109 mmol/L (ref 98–111)
Creatinine, Ser: 1.32 mg/dL — ABNORMAL HIGH (ref 0.44–1.00)
GFR, Estimated: 40 mL/min — ABNORMAL LOW (ref >60)
Glucose, Bld: 91 mg/dL (ref 70–99)
Potassium: 4.3 mmol/L (ref 3.5–5.1)
Sodium: 138 mmol/L (ref 135–145)
Total Bilirubin: 0.5 mg/dL (ref <1.2)
Total Protein: 5.9 g/dL — ABNORMAL LOW (ref 6.5–8.1)

## 2023-10-20 LAB — PHOSPHORUS: Phosphorus: 3.1 mg/dL (ref 2.5–4.6)

## 2023-10-20 LAB — MAGNESIUM: Magnesium: 1.8 mg/dL (ref 1.7–2.4)

## 2023-10-20 MED ORDER — TRAMADOL HCL 50 MG PO TABS: 50.0000 mg | ORAL_TABLET | Freq: Three times a day (TID) | ORAL | Status: DC | PRN

## 2023-10-20 MED ORDER — LACTATED RINGERS IV BOLUS
500.0000 mL | Freq: Once | INTRAVENOUS | Status: AC
  Administered 2023-10-20: 500 mL via INTRAVENOUS

## 2023-10-20 MED ORDER — CEPHALEXIN 250 MG PO CAPS
250.0000 mg | ORAL_CAPSULE | Freq: Every day | ORAL | Status: DC
  Administered 2023-10-20: 250 mg via ORAL
  Filled 2023-10-20: qty 1

## 2023-10-20 MED ORDER — CLOPIDOGREL BISULFATE 75 MG PO TABS
75.0000 mg | ORAL_TABLET | Freq: Every day | ORAL | Status: DC
  Administered 2023-10-20 – 2023-10-21 (×2): 75 mg via ORAL
  Filled 2023-10-20 (×2): qty 1

## 2023-10-20 NOTE — Plan of Care (Signed)

## 2023-10-20 NOTE — Progress Notes (Signed)
PROGRESS NOTE    Kathy George  ZOX:[id number] DOB: 14-Feb-1941 DOA: 10/18/2023 PCP: Tracey Harries, MD    Brief Narrative:  82 year old female with history of hypertension, coronary artery disease status post PCI and drug-eluting stent currently on Plavix, hyperlipidemia, previous history of presyncope related to carvedilol, CKD stage IIIb who presented to outside ER with complaints of high blood pressure.  Patient does complain of episodic high blood pressures with sensations of being hot and anxious. She was admitted the hospital, blood pressure was abruptly dropped resulting in orthostatic and AKI.  Subjective: Patient seen and examined.  Denies any complaints.  After initial episode of orthostatic hypotension yesterday, she has remained fairly stable.  Creatinine 1.34. Assessment & Plan:    Hypertensive crisis Iatrogenic orthostatic hypotension with presyncopal episode Labile blood pressures  Patient does have primary hypertension and orthostatic symptoms.  Headache and flushing with elevated blood pressures. She is on losartan 25 mg twice daily at home.  Added hydralazine caused her to have orthostatic symptoms. Renal Dopplers with no significant renal artery stenosis.  Echocardiogram pending's. All antihypertensives on hold today. Will introduce gradually to avoid sudden drop in blood pressures.  Will likely accept certain level of high blood pressures to avoid complications.  Coronary artery disease status post PCI with a stent: Resume Plavix.  Intolerance to statin.  Currently without any anginal symptoms.  AKI on CKD stage IIIb: Due to drop in blood pressure.  Treated with 500 cc normal saline.  Will repeat dose today.  Recheck renal functions tomorrow morning.   DVT prophylaxis: enoxaparin (LOVENOX) injection 30 mg Start: 10/19/23 1000   Code Status: Full code Family Communication: None at the bedside Disposition Plan: Status is: Inpatient Remains inpatient appropriate  because: AKI, IV fluids     Consultants:  None  Procedures:  None  Antimicrobials:  None     Objective: Vitals:   10/19/23 2258 10/20/23 0503 10/20/23 0827 10/20/23 1147  BP: (!) 140/68 (!) 146/71 (!) 155/76 (!) 158/84  Pulse: 78 75 77 77  Resp: 20 18 16 20   Temp: 97.8 F (36.6 C) (!) 97.5 F (36.4 C) 98 F (36.7 C) 97.9 F (36.6 C)  TempSrc: Oral Oral Oral Oral  SpO2: 98% 98% 98%   Weight:  63.2 kg    Height:        Intake/Output Summary (Last 24 hours) at 10/20/2023 1532 Last data filed at 10/20/2023 1451 Gross per 24 hour  Intake 390.17 ml  Output 1450 ml  Net -1059.83 ml   Filed Weights   10/18/23 2033 10/19/23 0300 10/20/23 0503  Weight: 61.2 kg 62.3 kg 63.2 kg    Examination:  General exam: Appears calm and comfortable  Respiratory system: Clear to auscultation. Respiratory effort normal. Cardiovascular system: S1 & S2 heard, RRR. No JVD, murmurs, rubs, gallops or clicks. No pedal edema. Gastrointestinal system: Abdomen is nondistended, soft and nontender. No organomegaly or masses felt. Normal bowel sounds heard. Central nervous system: Alert and oriented. No focal neurological deficits.     Data Reviewed: I have personally reviewed following labs and imaging studies  CBC: Recent Labs  Lab 10/18/23 2047 10/19/23 0516 10/20/23 0427  WBC 5.8 5.5 6.3  NEUTROABS  --   --  4.0  HGB 12.8 13.1 12.1  HCT 39.5 39.6 36.8  MCV 88.4 85.3 86.8  PLT 193 185 159   Basic Metabolic Panel: Recent Labs  Lab 10/18/23 2047 10/19/23 0516 10/20/23 0427  NA 141 138 138  K 3.7 3.4*  4.3  CL 105 105 109  CO2 26 22 24   GLUCOSE 115* 104* 91  BUN 31* 22 24*  CREATININE 1.17* 0.96 1.32*  CALCIUM 10.7* 10.0 9.5  MG  --  1.9 1.8  PHOS  --  2.9 3.1   GFR: Estimated Creatinine Clearance: 28.4 mL/min (A) (by C-G formula based on SCr of 1.32 mg/dL (H)). Liver Function Tests: Recent Labs  Lab 10/20/23 0427  AST 17  ALT 10  ALKPHOS 48  BILITOT 0.5   PROT 5.9*  ALBUMIN 3.1*   No results for input(s): "LIPASE", "AMYLASE" in the last 168 hours. No results for input(s): "AMMONIA" in the last 168 hours. Coagulation Profile: No results for input(s): "INR", "PROTIME" in the last 168 hours. Cardiac Enzymes: No results for input(s): "CKTOTAL", "CKMB", "CKMBINDEX", "TROPONINI" in the last 168 hours. BNP (last 3 results) No results for input(s): "PROBNP" in the last 8760 hours. HbA1C: No results for input(s): "HGBA1C" in the last 72 hours. CBG: No results for input(s): "GLUCAP" in the last 168 hours. Lipid Profile: No results for input(s): "CHOL", "HDL", "LDLCALC", "TRIG", "CHOLHDL", "LDLDIRECT" in the last 72 hours. Thyroid Function Tests: Recent Labs    10/19/23 0515  TSH 8.577*   Anemia Panel: No results for input(s): "VITAMINB12", "FOLATE", "FERRITIN", "TIBC", "IRON", "RETICCTPCT" in the last 72 hours. Sepsis Labs: No results for input(s): "PROCALCITON", "LATICACIDVEN" in the last 168 hours.  No results found for this or any previous visit (from the past 240 hours).       Radiology Studies: VAS US RENAL ARTERY DUPLEX Result Date: 10/20/2023 ABDOMINAL VISCERAL Patient Name:  ALOIS DRATH  Date of Exam:   10/20/2023 Medical Rec #: [id number]     Accession #:    [id number] Date of Birth: [birth year]     Patient Gender: F Patient Age:   24 years Exam Location:  [facility] Procedure:      VAS US RENAL ARTERY DUPLEX Referring Phys: [id number] A CALDWELL POWELL JR -------------------------------------------------------------------------------- Indications: Hypertension High Risk Factors: Hypertension, hyperlipidemia, current smoker, coronary artery                    disease. Other Factors: GERD, Raynaud's disease. Comparison Study: No priors. Performing Technologist: Marilynne Halsted RDMS, RVT  Examination Guidelines: A complete evaluation includes B-mode imaging, spectral Doppler, color Doppler, and power Doppler as needed of all  accessible portions of each vessel. Bilateral testing is considered an integral part of a complete examination. Limited examinations for reoccurring indications may be performed as noted.  Duplex Findings: +--------------------+--------+--------+------+--------------+ Mesenteric          PSV cm/sEDV cm/sPlaque   Comments    +--------------------+--------+--------+------+--------------+ Aorta Mid              55                                +--------------------+--------+--------+------+--------------+ Celiac Artery Origin  200                                +--------------------+--------+--------+------+--------------+ SMA Origin                                not visualized +--------------------+--------+--------+------+--------------+ SMA Proximal          175                                +--------------------+--------+--------+------+--------------+  SMA Mid               170                                +--------------------+--------+--------+------+--------------+    +------------------+--------+--------+-------+ Right Renal ArteryPSV cm/sEDV cm/sComment +------------------+--------+--------+-------+ Origin              118      25           +------------------+--------+--------+-------+ Proximal            149      30           +------------------+--------+--------+-------+ Mid                  64      16           +------------------+--------+--------+-------+ Distal               72      16           +------------------+--------+--------+-------+ +-----------------+--------+--------+-------+ Left Renal ArteryPSV cm/sEDV cm/sComment +-----------------+--------+--------+-------+ Origin              99      14           +-----------------+--------+--------+-------+ Proximal           100      18           +-----------------+--------+--------+-------+ Mid                 97      22            +-----------------+--------+--------+-------+ Distal             104      21           +-----------------+--------+--------+-------+ +------------+--------+--------+----+-----------+--------+--------+----+ Right KidneyPSV cm/sEDV cm/sRI  Left KidneyPSV cm/sEDV cm/sRI   +------------+--------+--------+----+-----------+--------+--------+----+ Upper Pole  29      10      0.62Upper Pole 28      11      0.59 +------------+--------+--------+----+-----------+--------+--------+----+ Mid         31      14      0.        31      10      0.66 +------------+--------+--------+----+-----------+--------+--------+----+ Lower Pole  30      9       0.68Lower Pole 32      11      0.69 +------------+--------+--------+----+-----------+--------+--------+----+ Hilar                           Hilar      31      9       0.72 +------------+--------+--------+----+-----------+--------+--------+----+ +------------------+-----+------------------+-----+ Right Kidney           Left Kidney             +------------------+-----+------------------+-----+ RAR                    RAR                     +------------------+-----+------------------+-----+ RAR (manual)      2.7  RAR (manual)      1.8   +------------------+-----+------------------+-----+ Cortex                 Cortex                  +------------------+-----+------------------+-----+  Cortex thickness       Corex thickness         +------------------+-----+------------------+-----+ Kidney length (cm)10.06Kidney length (cm)10.21 +------------------+-----+------------------+-----+   Summary: Renal:  Right: 1-59% stenosis of the right renal artery. Normal right        Resisitive Index. Normal size right kidney. RRV flow present.        Renal cyst 3.2 x 3.0. Left:  No evidence of left renal artery stenosis. Normal left        Resistive Index. Normal size of left kidney. LRV flow        present.  *See table(s)  above for measurements and observations.     Preliminary    CT HEAD WO CONTRAST ( ) Result Date: 10/19/2023 CLINICAL DATA:  82 year old female with sudden severe headache. EXAM: CT HEAD WITHOUT CONTRAST TECHNIQUE: Contiguous axial images were obtained from the base of the skull through the vertex without intravenous contrast. RADIATION DOSE REDUCTION: This exam was performed according to the departmental dose-optimization program which includes automated exposure control, adjustment of the mA and/or kV according to patient size and/or use of iterative reconstruction technique. COMPARISON:  Brain MRI 08/04/2019.  Head CT 07/17/2023. FINDINGS: Brain: Stable cerebral volume. No midline shift, ventriculomegaly, mass effect, evidence of mass lesion, intracranial hemorrhage or evidence of cortically based acute infarction. Patchy bilateral white matter hypodensity most pronounced in the anterior frontal lobes, stable gray-white matter differentiation throughout the brain. Vascular: No suspicious intracranial vascular hyperdensity. Calcified atherosclerosis at the skull base. Skull: Intact, negative.  Torus palatinus normal variant. Sinuses/Orbits: Visualized paranasal sinuses and mastoids are clear. Other: No acute orbit or scalp soft tissue finding. IMPRESSION: No acute intracranial abnormality. Stable non contrast CT appearance of the brain. Electronically Signed   By: Odessa Fleming M.D.   On: 10/19/2023 06:49   DG Chest Port 1 View Result Date: 10/18/2023 CLINICAL DATA:  Hypertension headache EXAM: PORTABLE CHEST 1 VIEW COMPARISON:  06/01/2023 FINDINGS: The heart size and mediastinal contours are within normal limits. Both lungs are clear. The visualized skeletal structures are unremarkable. Calcified breast implants. Aortic atherosclerosis. IMPRESSION: No active disease. Electronically Signed   By: Jasmine Pang M.D.   On: 10/18/2023 21:12        Scheduled Meds:  cephALEXin  250 mg Oral QHS    cholecalciferol  2,000 Units Oral Daily   clopidogrel  75 mg Oral Daily   enoxaparin (LOVENOX) injection  30 mg Subcutaneous Q24H   pantoprazole  80 mg Oral Daily   Continuous Infusions:   LOS: 0 days    Time spent: 35 minutes    Dorcas Carrow, MD [facility]

## 2023-10-21 DIAGNOSIS — I169 Hypertensive crisis, unspecified: Secondary | ICD-10-CM | POA: Diagnosis not present

## 2023-10-21 LAB — BASIC METABOLIC PANEL
Anion gap: 9 (ref 5–15)
BUN: 21 mg/dL (ref 8–23)
CO2: 22 mmol/L (ref 22–32)
Calcium: 9.7 mg/dL (ref 8.9–10.3)
Chloride: 105 mmol/L (ref 98–111)
Creatinine, Ser: 1.02 mg/dL — ABNORMAL HIGH (ref 0.44–1.00)
GFR, Estimated: 54 mL/min — ABNORMAL LOW (ref >60)
Glucose, Bld: 90 mg/dL (ref 70–99)
Potassium: 3.8 mmol/L (ref 3.5–5.1)
Sodium: 136 mmol/L (ref 135–145)

## 2023-10-21 MED ORDER — HYDROCHLOROTHIAZIDE 12.5 MG PO TABS: 12.5000 mg | ORAL_TABLET | Freq: Every day | ORAL | 0 refills | Status: AC

## 2023-10-21 MED ORDER — PROPRANOLOL HCL 40 MG PO TABS: 40.0000 mg | ORAL_TABLET | Freq: Two times a day (BID) | ORAL | 0 refills | Status: AC | PRN

## 2023-10-21 NOTE — Discharge Summary (Signed)
Physician Discharge Summary  Kathy George WUJ:[id number] DOB: 01/25/1941 DOA: 10/18/2023  PCP: Tracey Harries, MD  Admit date: 10/18/2023 Discharge date: 10/21/2023  Admitted From: Home Disposition: Home  Recommendations for Outpatient Follow-up:  Follow up with PCP in 1-2 weeks Please obtain BMP/CBC in one week Keep a log book of blood pressures at home and bring it to doctor's office for visit  Home Health: N/A Equipment/Devices: N/A  Discharge Condition: Stable CODE STATUS: Full code Diet recommendation: Low-salt diet  Discharge summary: 82 year old female with history of hypertension, coronary artery disease status post PCI and drug-eluting stent currently on Plavix, hyperlipidemia, previous history of presyncope related to carvedilol, CKD stage IIIb who presented to outside ER with complaints of high blood pressure.  Patient does complain of episodic high blood pressures with sensations of being hot and anxious. She was admitted the hospital, blood pressure was abruptly dropped resulting in orthostatic and AKI. Patient was then given isotonic fluid with improvement of symptoms, improvement of orthostatic symptoms and renal functions.  She remains overall stable.  Labile hypertension: Blood pressures unpredictable.  Renal ultrasound essentially normal.  Echocardiogram normal.  Blood pressures are fairly stable and less than 180. Continue losartan 25 mg twice daily Added hydrochlorothiazide 12.5 mg daily Close monitoring of blood pressure at home.  Recommended not to be very aggressive to drop blood pressures. She can also use propranolol 40 mg as needed at home if her blood pressure is more than 180.  Stable for discharge.  Discharge Diagnoses:  Principal Problem:   Hypertensive crisis Active Problems:   Hypertensive urgency    Discharge Instructions  Discharge Instructions     Diet - low sodium heart healthy   Complete by: As directed    Increase activity slowly    Complete by: As directed       Allergies as of 10/21/2023   No Known Allergies      Medication List     TAKE these medications    cephALEXin 250 MG capsule Commonly known as: KEFLEX Take 250 mg by mouth daily.   clopidogrel 75 MG tablet Commonly known as: PLAVIX TAKE 1 TABLET (75 MG TOTAL) BY MOUTH DAILY.   fluorouracil 5 % cream Commonly known as: EFUDEX Apply topically.   hydrochlorothiazide 12.5 MG tablet Commonly known as: HYDRODIURIL Take 1 tablet (12.5 mg total) by mouth daily.   losartan 25 MG tablet Commonly known as: COZAAR Take 25 mg by mouth in the morning and at bedtime. What changed: Another medication with the same name was removed. Continue taking this medication, and follow the directions you see here.   meloxicam 7.5 MG tablet Commonly known as: MOBIC Take 7.5 mg by mouth as needed for pain.   nitroGLYCERIN 0.4 MG SL tablet Commonly known as: NITROSTAT Place 1 tablet (0.4 mg total) under the tongue every 5 (five) minutes as needed.   pantoprazole 40 MG tablet Commonly known as: PROTONIX Take 80 mg by mouth daily.   propranolol 40 MG tablet Commonly known as: INDERAL Take 1 tablet (40 mg total) by mouth 2 (two) times daily as needed (for SBP >180). BP over 160 What changed:  medication strength how much to take when to take this reasons to take this   Ultram 50 MG tablet Generic drug: traMADol Take 50 mg by mouth 3 (three) times daily as needed for moderate pain (pain score 4-6) or severe pain (pain score 7-10).   Vitamin D3 50 MCG (2000 UT) capsule Take 2,000 Units by mouth  daily. 1 tablet once a day        No Known Allergies  Consultations: None   Procedures/Studies: VAS US RENAL ARTERY DUPLEX Result Date: 10/20/2023 ABDOMINAL VISCERAL Patient Name:  Kathy George  Date of Exam:   10/20/2023 Medical Rec #: [id number]     Accession #:    [id number] Date of Birth: July 30, 1941     Patient Gender: F Patient Age:   60 years Exam  Location:  [facility] Procedure:      VAS US RENAL ARTERY DUPLEX Referring Phys: [id number] A CALDWELL POWELL JR -------------------------------------------------------------------------------- Indications: Hypertension High Risk Factors: Hypertension, hyperlipidemia, current smoker, coronary artery                    disease. Other Factors: GERD, Raynaud's disease. Comparison Study: No priors. Performing Technologist: Marilynne Halsted RDMS, RVT  Examination Guidelines: A complete evaluation includes B-mode imaging, spectral Doppler, color Doppler, and power Doppler as needed of all accessible portions of each vessel. Bilateral testing is considered an integral part of a complete examination. Limited examinations for reoccurring indications may be performed as noted.  Duplex Findings: +--------------------+--------+--------+------+--------------+ Mesenteric          PSV cm/sEDV cm/sPlaque   Comments    +--------------------+--------+--------+------+--------------+ Aorta Mid              55                                +--------------------+--------+--------+------+--------------+ Celiac Artery Origin  200                                +--------------------+--------+--------+------+--------------+ SMA Origin                                not visualized +--------------------+--------+--------+------+--------------+ SMA Proximal          175                                +--------------------+--------+--------+------+--------------+ SMA Mid               170                                +--------------------+--------+--------+------+--------------+    +------------------+--------+--------+-------+ Right Renal ArteryPSV cm/sEDV cm/sComment +------------------+--------+--------+-------+ Origin              118      25           +------------------+--------+--------+-------+ Proximal            149      30            +------------------+--------+--------+-------+ Mid                  64      16           +------------------+--------+--------+-------+ Distal               72      16           +------------------+--------+--------+-------+ +-----------------+--------+--------+-------+ Left Renal ArteryPSV cm/sEDV cm/sComment +-----------------+--------+--------+-------+ Origin              99  14           +-----------------+--------+--------+-------+ Proximal           100      18           +-----------------+--------+--------+-------+ Mid                 97      22           +-----------------+--------+--------+-------+ Distal             104      21           +-----------------+--------+--------+-------+ +------------+--------+--------+----+-----------+--------+--------+----+ Right KidneyPSV cm/sEDV cm/sRI  Left KidneyPSV cm/sEDV cm/sRI   +------------+--------+--------+----+-----------+--------+--------+----+ Upper Pole  29      10      0.62Upper Pole 28      11      0.59 +------------+--------+--------+----+-----------+--------+--------+----+ Mid         31      14      0.        31      10      0.66 +------------+--------+--------+----+-----------+--------+--------+----+ Lower Pole  30      9       0.68Lower Pole 32      11      0.69 +------------+--------+--------+----+-----------+--------+--------+----+ Hilar                           Hilar      31      9       0.72 +------------+--------+--------+----+-----------+--------+--------+----+ +------------------+-----+------------------+-----+ Right Kidney           Left Kidney             +------------------+-----+------------------+-----+ RAR                    RAR                     +------------------+-----+------------------+-----+ RAR (manual)      2.7  RAR (manual)      1.8   +------------------+-----+------------------+-----+ Cortex                 Cortex                   +------------------+-----+------------------+-----+ Cortex thickness       Corex thickness         +------------------+-----+------------------+-----+ Kidney length (cm)10.06Kidney length (cm)10.21 +------------------+-----+------------------+-----+  Summary: Renal:  Right: 1-59% stenosis of the right renal artery. Normal right        Resisitive Index. Normal size right kidney. RRV flow present.        Renal cyst 3.2 x 3.0. Left:  No evidence of left renal artery stenosis. Normal left        Resistive Index. Normal size of left kidney. LRV flow        present.  *See table(s) above for measurements and observations.  Diagnosing physician: Gerarda Fraction  Electronically signed by Gerarda Fraction on 10/20/2023 at 6:45:38 PM.    Final    ECHOCARDIOGRAM COMPLETE Result Date: 10/20/2023    ECHOCARDIOGRAM REPORT   Patient Name:   Kathy George Date of Exam: 10/20/2023 Medical Rec #:  [id number]    Height:       63.0 in Accession #:    [id number]   Weight:       139.3 lb Date of Birth:  1941/01/16    BSA:  1.658 m Patient Age:    82 years     BP:           158/84 mmHg Patient Gender: F            HR:           68 bpm. Exam Location:  Inpatient Procedure: 2D Echo, Cardiac Doppler and Color Doppler Indications:    Elevated Troponin  History:        Patient has no prior history of Echocardiogram examinations.                 CAD, Arrythmias:Atrial Fibrillation; Risk Factors:Hypertension.  Sonographer:    Melton Krebs RDCS, FE, PE Referring Phys: [id number] Darlin Drop  Sonographer Comments: Suboptimal apical window. Image acquisition challenging due to breast implants. IMPRESSIONS  1. Left ventricular ejection fraction, by estimation, is 60 to 65%. The left ventricle has normal function. The left ventricle has no regional wall motion abnormalities. There is mild concentric left ventricular hypertrophy. Left ventricular diastolic parameters are consistent with Grade I diastolic dysfunction (impaired  relaxation).  2. Right ventricular systolic function is normal. The right ventricular size is normal. There is normal pulmonary artery systolic pressure. The estimated right ventricular systolic pressure is 14.3 mmHg.  3. The mitral valve is normal in structure. Trivial mitral valve regurgitation. No evidence of mitral stenosis. Moderate mitral annular calcification.  4. The aortic valve is tricuspid. There is mild calcification of the aortic valve. Aortic valve regurgitation is not visualized. No aortic stenosis is present.  5. The inferior vena cava is normal in size with greater than 50% respiratory variability, suggesting right atrial pressure of 3 mmHg. FINDINGS  Left Ventricle: Left ventricular ejection fraction, by estimation, is 60 to 65%. The left ventricle has normal function. The left ventricle has no regional wall motion abnormalities. The left ventricular internal cavity size was normal in size. There is  mild concentric left ventricular hypertrophy. Left ventricular diastolic parameters are consistent with Grade I diastolic dysfunction (impaired relaxation). Right Ventricle: The right ventricular size is normal. No increase in right ventricular wall thickness. Right ventricular systolic function is normal. There is normal pulmonary artery systolic pressure. The tricuspid regurgitant velocity is 1.68 m/s, and  with an assumed right atrial pressure of 3 mmHg, the estimated right ventricular systolic pressure is 14.3 mmHg. Left Atrium: Left atrial size was normal in size. Right Atrium: Right atrial size was normal in size. Pericardium: Trivial pericardial effusion is present. Mitral Valve: The mitral valve is normal in structure. Moderate mitral annular calcification. Trivial mitral valve regurgitation. No evidence of mitral valve stenosis. Tricuspid Valve: The tricuspid valve is normal in structure. Tricuspid valve regurgitation is trivial. Aortic Valve: The aortic valve is tricuspid. There is mild  calcification of the aortic valve. Aortic valve regurgitation is not visualized. No aortic stenosis is present. Aortic valve mean gradient measures 5.0 mmHg. Aortic valve peak gradient measures 8.8 mmHg. Aortic valve area, by VTI measures 2.64 cm. Pulmonic Valve: The pulmonic valve was normal in structure. Pulmonic valve regurgitation is not visualized. Aorta: The aortic root is normal in size and structure. Venous: The inferior vena cava is normal in size with greater than 50% respiratory variability, suggesting right atrial pressure of 3 mmHg. IAS/Shunts: No atrial level shunt detected by color flow Doppler.  LEFT VENTRICLE PLAX 2D LVIDd:         4.00 cm   Diastology LVIDs:         2.20 cm  LV e' medial:    5.33 cm/s LV PW:         0.90 cm   LV E/e' medial:  18.5 LV IVS:        1.30 cm   LV e' lateral:   5.11 cm/s LVOT diam:     2.00 cm   LV E/e' lateral: 19.3 LV SV:         79 LV SV Index:   47 LVOT Area:     3.14 cm  RIGHT VENTRICLE RV S prime:     15.60 cm/s LEFT ATRIUM           Index        RIGHT ATRIUM          Index LA diam:      3.30 cm 1.99 cm/m   RA Area:     9.27 cm LA Vol (A4C): 51.6 ml 31.12 ml/m  RA Volume:   16.60 ml 10.01 ml/m  AORTIC VALVE AV Area (Vmax):    2.91 cm AV Area (Vmean):   2.80 cm AV Area (VTI):     2.64 cm AV Vmax:           148.00 cm/s AV Vmean:          98.700 cm/s AV VTI:            0.298 m AV Peak Grad:      8.8 mmHg AV Mean Grad:      5.0 mmHg LVOT Vmax:         137.00 cm/s LVOT Vmean:        88.000 cm/s LVOT VTI:          0.250 m LVOT/AV VTI ratio: 0.84  AORTA Ao Root diam: 3.30 cm Ao Asc diam:  3.40 cm MITRAL VALVE                TRICUSPID VALVE MV Area (PHT): 3.03 cm     TR Peak grad:   11.3 mmHg MV Decel Time: 250 msec     TR Vmax:        168.00 cm/s MV E velocity: 98.50 cm/s MV A velocity: 124.00 cm/s  SHUNTS MV E/A ratio:  0.79         Systemic VTI:  0.25 m                             Systemic Diam: 2.00 cm Dalton McleanMD Electronically signed by Wilfred Lacy  Signature Date/Time: 10/20/2023/5:15:13 PM    Final    CT HEAD WO CONTRAST ( ) Result Date: 10/19/2023 CLINICAL DATA:  82 year old female with sudden severe headache. EXAM: CT HEAD WITHOUT CONTRAST TECHNIQUE: Contiguous axial images were obtained from the base of the skull through the vertex without intravenous contrast. RADIATION DOSE REDUCTION: This exam was performed according to the departmental dose-optimization program which includes automated exposure control, adjustment of the mA and/or kV according to patient size and/or use of iterative reconstruction technique. COMPARISON:  Brain MRI 08/04/2019.  Head CT 07/17/2023. FINDINGS: Brain: Stable cerebral volume. No midline shift, ventriculomegaly, mass effect, evidence of mass lesion, intracranial hemorrhage or evidence of cortically based acute infarction. Patchy bilateral white matter hypodensity most pronounced in the anterior frontal lobes, stable gray-white matter differentiation throughout the brain. Vascular: No suspicious intracranial vascular hyperdensity. Calcified atherosclerosis at the skull base. Skull: Intact, negative.  Torus palatinus normal variant. Sinuses/Orbits: Visualized paranasal sinuses and mastoids are clear. Other:  No acute orbit or scalp soft tissue finding. IMPRESSION: No acute intracranial abnormality. Stable non contrast CT appearance of the brain. Electronically Signed   By: Odessa Fleming M.D.   On: 10/19/2023 06:49   DG Chest Port 1 View Result Date: 10/18/2023 CLINICAL DATA:  Hypertension headache EXAM: PORTABLE CHEST 1 VIEW COMPARISON:  06/01/2023 FINDINGS: The heart size and mediastinal contours are within normal limits. Both lungs are clear. The visualized skeletal structures are unremarkable. Calcified breast implants. Aortic atherosclerosis. IMPRESSION: No active disease. Electronically Signed   By: Jasmine Pang M.D.   On: 10/18/2023 21:12   (Echo, Carotid, EGD, Colonoscopy, ERCP)    Subjective: Patient seen and  examined.  Denies any complaints.  Daughter on the phone.  Eager to go home.  Blood pressures 148/68.   Discharge Exam: Vitals:   10/21/23 0353 10/21/23 0724  BP: (!) 148/68 (!) 160/72  Pulse: 66 71  Resp: 16 16  Temp: 97.7 F (36.5 C) 97.6 F (36.4 C)  SpO2: 95% 96%   Vitals:   10/20/23 1914 10/20/23 2347 10/21/23 0353 10/21/23 0724  BP: (!) 142/69 (!) 149/73 (!) 148/68 (!) 160/72  Pulse: 77 83 66 71  Resp: 16 20 16 16   Temp: 98.1 F (36.7 C) 97.8 F (36.6 C) 97.7 F (36.5 C) 97.6 F (36.4 C)  TempSrc: Oral Oral Oral Oral  SpO2: 100% 97% 95% 96%  Weight:   63.3 kg   Height:        General: Pt is alert, awake, not in acute distress Cardiovascular: RRR, S1/S2 +, no rubs, no gallops Respiratory: CTA bilaterally, no wheezing, no rhonchi Abdominal: Soft, NT, ND, bowel sounds + Extremities: no edema, no cyanosis    The results of significant diagnostics from this hospitalization (including imaging, microbiology, ancillary and laboratory) are listed below for reference.     Microbiology: No results found for this or any previous visit (from the past 240 hours).   Labs: BNP (last 3 results) No results for input(s): "BNP" in the last 8760 hours. Basic Metabolic Panel: Recent Labs  Lab 10/18/23 2047 10/19/23 0516 10/20/23 0427 10/21/23 0357  NA 141 138 138 136  K 3.7 3.4* 4.3 3.8  CL 105 105 109 105  CO2 26 22 24 22   GLUCOSE 115* 104* 91 90  BUN 31* 22 24* 21  CREATININE 1.17* 0.96 1.32* 1.02*  CALCIUM 10.7* 10.0 9.5 9.7  MG  --  1.9 1.8  --   PHOS  --  2.9 3.1  --    Liver Function Tests: Recent Labs  Lab 10/20/23 0427  AST 17  ALT 10  ALKPHOS 48  BILITOT 0.5  PROT 5.9*  ALBUMIN 3.1*   No results for input(s): "LIPASE", "AMYLASE" in the last 168 hours. No results for input(s): "AMMONIA" in the last 168 hours. CBC: Recent Labs  Lab 10/18/23 2047 10/19/23 0516 10/20/23 0427  WBC 5.8 5.5 6.3  NEUTROABS  --   --  4.0  HGB 12.8 13.1 12.1  HCT  39.5 39.6 36.8  MCV 88.4 85.3 86.8  PLT 193 185 159   Cardiac Enzymes: No results for input(s): "CKTOTAL", "CKMB", "CKMBINDEX", "TROPONINI" in the last 168 hours. BNP: Invalid input(s): "POCBNP" CBG: No results for input(s): "GLUCAP" in the last 168 hours. D-Dimer Recent Labs    10/18/23 2047  DDIMER 0.62*   Hgb A1c No results for input(s): "HGBA1C" in the last 72 hours. Lipid Profile No results for input(s): "CHOL", "HDL", "LDLCALC", "TRIG", "CHOLHDL", "LDLDIRECT"  in the last 72 hours. Thyroid function studies Recent Labs    10/19/23 0515  TSH 8.577*   Anemia work up No results for input(s): "VITAMINB12", "FOLATE", "FERRITIN", "TIBC", "IRON", "RETICCTPCT" in the last 72 hours. Urinalysis    Component Value Date/Time   COLORURINE COLORLESS (A) 10/18/2023 2343   APPEARANCEUR CLEAR 10/18/2023 2343   LABSPEC 1.009 10/18/2023 2343   PHURINE 6.5 10/18/2023 2343   GLUCOSEU NEGATIVE 10/18/2023 2343   HGBUR NEGATIVE 10/18/2023 2343   BILIRUBINUR NEGATIVE 10/18/2023 2343   KETONESUR NEGATIVE 10/18/2023 2343   PROTEINUR NEGATIVE 10/18/2023 2343   UROBILINOGEN 0.2 05/27/2009 0556   NITRITE NEGATIVE 10/18/2023 2343   LEUKOCYTESUR NEGATIVE 10/18/2023 2343   Sepsis Labs Recent Labs  Lab 10/18/23 2047 10/19/23 0516 10/20/23 0427  WBC 5.8 5.5 6.3   Microbiology No results found for this or any previous visit (from the past 240 hours).   Time coordinating discharge: 32 minutes  SIGNED:   Dorcas Carrow, MD  [facility] 10/21/2023, 9:30 AM

## 2023-10-21 NOTE — Progress Notes (Signed)
Occupational Therapy Treatment Patient Details Name: Kathy George MRN: [id number] DOB: 11-May-1941 Today's Date: 10/21/2023   History of present illness 82 y.o. female admitted 10/18/23 with HTN, severe headache. Workup for hypertensive crisis, associated demand ischemia. PMH includes CAD, CKD, HTN, CHF, PAF, SVT, scleroderma, depression, anxiety.   OT comments  Pt progressing well towards established OT goals. Pt performing ADLs and functional mobility at independent level near baseline function. Providing energy conservation handout and education for ADLs and IADLs. Pt verbalized understanding. BP stable throughout: supine 164/74 (101), sitting 159/97 (113), and 155/79 (102). Pt planning for dc later today and very eager to return home. All acute OT needs met and will sign off. Thank you.       If plan is discharge home, recommend the following:      Equipment Recommendations  None recommended by OT    Recommendations for Other Services      Precautions / Restrictions Precautions Precautions: None       Mobility Bed Mobility Overal bed mobility: Independent                  Transfers Overall transfer level: Independent                       Balance Overall balance assessment: No apparent balance deficits (not formally assessed)                                         ADL either performed or assessed with clinical judgement   ADL Overall ADL's : Independent                                       General ADL Comments: Provided handout on energy conservation techniques for ADLs and IADLs. Pt verbalized understanding and appreciative of information    Extremity/Trunk Assessment Upper Extremity Assessment Upper Extremity Assessment: Overall WFL for tasks assessed   Lower Extremity Assessment Lower Extremity Assessment: Overall WFL for tasks assessed        Vision       Perception     Praxis      Cognition  Arousal: Alert Behavior During Therapy: WFL for tasks assessed/performed Overall Cognitive Status: Within Functional Limits for tasks assessed                                          Exercises      Shoulder Instructions       General Comments BP supine 164/74 (101), sitting 159/97 (113), and 155/79 (102). HR 60-70s. No dizziness    Pertinent Vitals/ Pain       Pain Assessment Pain Assessment: Faces Faces Pain Scale: No hurt Pain Intervention(s): Monitored during session  Home Living                                          Prior Functioning/Environment              Frequency  Min 1X/week        Progress Toward Goals  OT Goals(current goals can now be found in the  care plan section)  Progress towards OT goals: Goals met/education completed, patient discharged from OT  Acute Rehab OT Goals OT Goal Formulation: With patient Time For Goal Achievement: 11/02/23 Potential to Achieve Goals: Good ADL Goals Pt Will Perform Grooming: with modified independence;standing Pt Will Perform Lower Body Dressing: with modified independence;sit to/from stand Pt Will Transfer to Toilet: with modified independence;ambulating;regular height toilet  Plan      Co-evaluation                 AM-PAC OT "6 Clicks" Daily Activity     Outcome Measure   Help from another person eating meals?: None Help from another person taking care of personal grooming?: None Help from another person toileting, which includes using toliet, bedpan, or urinal?: A Little Help from another person bathing (including washing, rinsing, drying)?: A Little Help from another person to put on and taking off regular upper body clothing?: None Help from another person to put on and taking off regular lower body clothing?: A Little 6 Click Score: 21    End of Session    OT Visit Diagnosis: Unsteadiness on feet (R26.81);Other abnormalities of gait and mobility  (R26.89);Muscle weakness (generalized) (M62.81)   Activity Tolerance Patient tolerated treatment well   Patient Left in chair;with call bell/phone within reach   Nurse Communication Mobility status        Time: 0935-1000 OT Time Calculation (min): 25 min  Charges: OT General Charges $OT Visit: 1 Visit OT Treatments $Self Care/Home Management : 23-37 mins  Liann Spaeth MSOT, OTR/L Acute Rehab Office: [phone]   Theodoro Grist Cadin Luka 10/21/2023, 11:00 AM

## 2023-10-21 NOTE — Plan of Care (Signed)
  Problem: Clinical Measurements: Goal: Ability to maintain clinical measurements within normal limits will improve Outcome: Progressing Goal: Respiratory complications will improve Outcome: Progressing Goal: Cardiovascular complication will be avoided Outcome: Progressing   Problem: Safety: Goal: Ability to remain free from injury will improve Outcome: Progressing

## 2023-10-21 NOTE — TOC CM/SW Note (Signed)
Transition of Care [facility]) - Inpatient Brief Assessment   Patient Details  Name: Kathy George MRN: [id number] Date of Birth: 02-Dec-1940  Transition of Care [facility]) CM/SW Contact:    Gala Lewandowsky, RN Phone Number: 10/21/2023, 10:16 AM   Clinical Narrative: Patient presented for hypertensive crisis and severe headache. PTA patient was from home with support of daughter. Case Manager discussed the option of HH RN for the patient and she declined. No further home needs identified at this time. Daughter to transport home.     Transition of Care Asessment: Insurance and Status: Insurance coverage has been reviewed Patient has primary care physician: Yes Home environment has been reviewed: reviewed Prior level of function:: Independent Prior/Current Home Services: No current home services Social Drivers of Health Review: SDOH reviewed no interventions necessary Readmission risk has been reviewed: Yes Transition of care needs: no transition of care needs at this time

## 2023-10-24 LAB — CATECHOLAMINES,UR.,FREE,24 HR
Dopamine, Rand Ur: 69 ug/L
Dopamine, Ur, 24Hr: 86 ug/(24.h) (ref 0–510)
Epinephrine, Rand Ur: 5 ug/L
Epinephrine, U, 24Hr: 6 ug/(24.h) (ref 0–20)
Norepinephrine, Rand Ur: <15 ug/L
Norepinephrine,U,24H: <19 ug/(24.h) (ref 0–135)
Total Volume: 1250

## 2023-10-25 LAB — ALDOSTERONE + RENIN ACTIVITY W/ RATIO
ALDO / PRA Ratio: 5.7 (ref 0.0–30.0)
Aldosterone: 2.9 ng/dL (ref 0.0–30.0)
PRA LC/MS/MS: 0.51 ng/mL/h (ref 0.167–5.380)

## 2023-10-27 LAB — METANEPHRINES, URINE, 24 HOUR
Metaneph Total, Ur: 170 ug/L
Metanephrines, 24H Ur: 213 ug/(24.h) — ABNORMAL HIGH (ref 36–209)
Normetanephrine, 24H Ur: 253 ug/(24.h) (ref 131–612)
Normetanephrine, Ur: 202 ug/L
Total Volume: 1250

## 2024-08-18 ENCOUNTER — Ambulatory Visit (HOSPITAL_BASED_OUTPATIENT_CLINIC_OR_DEPARTMENT_OTHER): Attending: Family Medicine | Admitting: Physical Therapy

## 2024-08-18 ENCOUNTER — Encounter (HOSPITAL_BASED_OUTPATIENT_CLINIC_OR_DEPARTMENT_OTHER): Payer: Self-pay | Admitting: Physical Therapy

## 2024-08-18 ENCOUNTER — Other Ambulatory Visit: Payer: Self-pay

## 2024-08-18 DIAGNOSIS — M7072 Other bursitis of hip, left hip: Secondary | ICD-10-CM | POA: Diagnosis present

## 2024-08-18 DIAGNOSIS — M25551 Pain in right hip: Secondary | ICD-10-CM | POA: Insufficient documentation

## 2024-08-18 DIAGNOSIS — M25552 Pain in left hip: Secondary | ICD-10-CM | POA: Insufficient documentation

## 2024-08-18 DIAGNOSIS — G8929 Other chronic pain: Secondary | ICD-10-CM | POA: Diagnosis present

## 2024-08-18 DIAGNOSIS — M7071 Other bursitis of hip, right hip: Secondary | ICD-10-CM | POA: Diagnosis present

## 2024-08-18 DIAGNOSIS — R2681 Unsteadiness on feet: Secondary | ICD-10-CM | POA: Diagnosis present

## 2024-08-18 DIAGNOSIS — M6281 Muscle weakness (generalized): Secondary | ICD-10-CM | POA: Insufficient documentation

## 2024-08-18 NOTE — Therapy (Unsigned)
 OUTPATIENT PHYSICAL THERAPY LOWER EXTREMITY EVALUATION   Patient Name: Kathy George MRN: [id number] DOB:21-Mar-1941, 83 y.o., female Today's Date: 08/19/2024  END OF SESSION:  PT End of Session - 08/18/24 1324     Visit Number 1    Date for Recertification  10/29/24    Authorization Type [employer] mcr    PT Start Time 1315    PT Stop Time 1400    PT Time Calculation (min) 45 min    Activity Tolerance Patient tolerated treatment well    Behavior During Therapy WFL for tasks assessed/performed          Past Medical History:  Diagnosis Date   Anemia    Anxiety    CAD (coronary artery disease)    successful IVUs guided PCI of the lesion in the  proximal LAD using a promus drug-eluting stent with improvement in Fentanyl narrowing from 80% to 0%   Depression    Dyslipidemia    GERD (gastroesophageal reflux disease)    H/O: hysterectomy    Hiatal hernia    Hypertension    Paroxysmal atrial fibrillation (HCC)    Raynaud's disease    Scleroderma (HCC)    Supraventricular tachycardia    SUPRAVENTRICULAR TACHYCARDIA    Past Surgical History:  Procedure Laterality Date   BREAST ENHANCEMENT SURGERY     right rotator cuff repair     TOTAL ABDOMINAL HYSTERECTOMY     Patient Active Problem List   Diagnosis Date Noted   Hypertensive urgency 10/20/2023   Hypertensive crisis 10/19/2023   Bradycardia 12/13/2014   TIA (transient ischemic attack) 05/27/2014   Skin lesion 10/13/2013   Cough 01/17/2012   Scleroderma (HCC) 01/17/2012   Edema 08/05/2011   THYROID  NODULE, RIGHT 07/03/2009   Elevated lipids 12/31/2007   ANEMIA 12/31/2007   ANXIETY 12/31/2007   DEPRESSION 12/31/2007   Essential hypertension 12/31/2007   Coronary atherosclerosis 12/31/2007   ATRIAL FIBRILLATION, PAROXYSMAL 12/31/2007   SUPRAVENTRICULAR TACHYCARDIA 12/31/2007   RAYNAUD'S DISEASE 12/31/2007   GERD 12/31/2007   HIATAL HERNIA 12/31/2007    PCP: Pura Lenis, MD   REFERRING PROVIDER: Pura Lenis,  MD   REFERRING DIAG: R29.6 (ICD-10-CM) - Repeated falls   THERAPY DIAG:  Bursitis of both hips, unspecified bursa  Chronic hip pain, bilateral  Muscle weakness (generalized)  Unsteadiness on feet  Rationale for Evaluation and Treatment: Rehabilitation  ONSET DATE: 3 years  SUBJECTIVE:   SUBJECTIVE STATEMENT: Have had hip bursitis for past 3 years.  Have had a few steroid shots but they did not work. I have had many near falls but not actually fell.  Not using a cane.  Walk my little dog 1-2 x daily for about 20 minutes. Would like to decrease hip pain and be able to walk longer  PERTINENT HISTORY: DR Pura: - Referral for water therapy and physical therapy will be initiated to improve motion, flexibility, and strength. - Therapy will also address limb girdle stability and strengthening to help prevent frequent falls PAIN:  Are you having pain? Yes: NPRS scale: current 2/10; worst after 20 min walk 6/10; least 0/10 Pain location: trochanters R>L Pain description: ache Aggravating factors: walking/standing Relieving factors: sitting; lying down  PRECAUTIONS: Fall  RED FLAGS: None   WEIGHT BEARING RESTRICTIONS: No  FALLS:  Has patient fallen in last 6 months? No  LIVING ENVIRONMENT: Lives with: lives with their daughter Lives in: House/apartment Stairs: Yes: External: 2 steps; on right going up Has following equipment at home: Single point cane  OCCUPATION: retired  PLOF: Independent  PATIENT GOALS: ease pain  NEXT MD VISIT: 1 month  OBJECTIVE:  Note: Objective measures were completed at Evaluation unless otherwise noted.  DIAGNOSTIC FINDINGS: none in chart  PATIENT SURVEYS:  LEFS:18/80  COGNITION: Overall cognitive status: Within functional limits for tasks assessed     SENSATION: WFL   MUSCLE LENGTH: Hamstrings: Left tight tested in sitting   POSTURE: rounded shoulders, forward head, and increased thoracic kyphosis  PALPATION: Mod TTP  about bilat trochanter  LOWER EXTREMITY ROM:  wfl  LOWER EXTREMITY MMT:  MMT Right eval Left eval  Hip flexion 4- 4-  Hip extension 4 4  Hip abduction 5 5  Hip adduction 5 5  Hip internal rotation    Hip external rotation    Knee flexion 5 5  Knee extension 5 5  Ankle dorsiflexion    Ankle plantarflexion    Ankle inversion    Ankle eversion     (Blank rows = not tested)   FUNCTIONAL TESTS:  5 times sit to stand: 18.69 Timed up and go (TUG): 12.44s  4 stage balance: Passed 1&2; Tandem unsteady x 10s; SLS x 3s GAIT: Distance walked: 500 ft Assistive device utilized: Single point cane Level of assistance: Complete Independence Comments: slight reduction in bilatral hip flex during swing                                                                                                                                TREATMENT  Eval Self care:Posture and Optometrist instruction    PATIENT EDUCATION:  Education details: Discussed eval findings, rehab rationale, aquatic program progression/POC and pools in area. Patient is in agreement  Person educated: Patient Education method: Explanation Education comprehension: verbalized understanding  HOME EXERCISE  Eval Self care:Posture and Optometrist instruction   ASSESSMENT:  CLINICAL IMPRESSION: Patient is a 83 y.o. f who was seen today for physical therapy evaluation and treatment for hip bursitis and near falls. She is an active senior who spends time walking small dog, shopiing and reading.  She presents with pain limited deficits in bilat hips R>L limiting endurance, activity tolerance, gait, balance, and functional mobility with ADL's. She is having to modify and restrict ADL's as indicated by outcome measure score as well as subjective information and objective measures.  She will benefit from skilled PT with optimal outcomes expected engaging in both aquatic and land setting to improve all areas of deficits and  improve toleration with walking.   OBJECTIVE IMPAIRMENTS: Abnormal gait, decreased activity tolerance, decreased balance, decreased endurance, decreased mobility, difficulty walking, decreased strength, and pain.   ACTIVITY LIMITATIONS: carrying, lifting, bending, sitting, standing, squatting, and locomotion level  PARTICIPATION LIMITATIONS: shopping, community activity, and yard work  PERSONAL FACTORS: Time since onset of injury/illness/exacerbation are also affecting patient's functional outcome.   REHAB POTENTIAL: Good  CLINICAL DECISION MAKING: Stable/uncomplicated  EVALUATION COMPLEXITY: Low   GOALS: Goals reviewed  with patient? Yes  SHORT TERM GOALS: Target date: 09/19/24 Pt will tolerate full aquatic sessions consistently without increase in pain and with improving function to demonstrate good toleration and effectiveness of intervention.  Baseline: Goal status: INITIAL  2.  Pt will consider gaining pool access for use of the properties of water for chronic conditions maintaining mobility and minimizing pain. Baseline:  Goal status: INITIAL    LONG TERM GOALS: Target date: 10/29/24  Pt to improve on LEFS by at least 9 point to demonstrate statistically significant Improvement in function. Baseline: 18/80 Goal status: INITIAL  2.  Pt will report decrease in pain by at least 50% for improved toleration to activity/quality of life and to demonstrate improved management of pain. Baseline: ee chart Goal status: INITIAL  3.  Pt will improve strength in all hip flex and ext to 5/5  to demonstrate improved overall physical function Baseline: see chart Goal status: INITIAL  4.  Pt will report ability to walk dog up to 30 minutes without pain limitation Baseline:  Goal status: INITIAL  5.  Pt will be indep with final HEP's (land and aquatic as appropriate) for continued management of condition Baseline:  Goal status: INITIAL    PLAN:  PT FREQUENCY:  1-2x/week  PT DURATION: 10 weeks  PLANNED INTERVENTIONS: 97164- PT Re-evaluation, 97750- Physical Performance Testing, 97110-Therapeutic exercises, 97530- Therapeutic activity, [id number]- Neuromuscular re-education, 97535- Self Care, [id number]- Manual therapy, [id number]- Gait training, [phone]- Aquatic Therapy, [phone] (1-2 muscles), [id number] (3+ muscles)- Dry Needling, Patient/Family education, Balance training, Stair training, Taping, Joint mobilization, DME instructions, Cryotherapy, and Moist heat  PLAN FOR NEXT SESSION: initiate in aquatic then transition on land. Strengthening, stretching and balance retraining LE and core. Pain management   Ronal Foots) Baden Betsch MPT 08/19/24 5:37 PM [facility] [address] [place], [place], [address] Phone: [phone]   Fax:  [phone]  Date of referral: 06/19/24 Referring provider: Pura Lenis, MD  Referring diagnosis? Repeated falls Treatment diagnosis? (if different than referring diagnosis) added bilat hip bursitis  What was this (referring dx) caused by? Ongoing Issue  Lysle of Condition: Chronic (continuous duration > 3 months)   Laterality: Both  Current Functional Measure Score: LEFS 18/80  Objective measurements identify impairments when they are compared to normal values, the uninvolved extremity, and prior level of function.  []  Yes  []  No  Objective assessment of functional ability: Minimal functional limitations   Briefly describe symptoms: pain in bilat hips limiting amb tolerance, stair climbing and adl's  How did symptoms start: pain  Average pain intensity:  Last 24 hours: 4/10  Past week: 4/10  How often does the pt experience symptoms? Frequently  How much have the symptoms interfered with usual daily activities? Moderately  How has condition changed since care began at this facility? NA - initial visit  In general, how is the patients overall health? Very  Good   BACK PAIN (STarT Back Screening Tool) No

## 2024-09-06 NOTE — Progress Notes (Deleted)
 09/06/2024 Kathy George   03-03-1941  [id number]  Primary Physician Pura Lenis, MD Primary Cardiologist: Delf[place] EP:  Inocencio  Reason for Visit/CC: Routine f/u for CAD Pre syncope   HPI:  83 y.o.  female who presents to clinic for routine f/u for CAD  More recently seen by PA and Dr Inocencio . In 11/2007 she underwent stenting of her proximal LAD with a DES. EF has been normal by echo She had some pre syncope while on coreg  in 2019 with monitor showing some self limited SVT and seen by Dr Inocencio. Symptoms improved off beta blocker and no further w/u done   Last myovue done 08/07/18 non ischemic EF 80%   She does not smoke. She drinks 1-2 cups of coffee in the mornings. No excessive sodium in her diet. She drinks socially, occasionally having a glass of wine or 2.  She is more sedentary and not walking   Has a grandson that is brilliant in math and is looking at graduate school in [place] with girlfriend  Has some chronic cystitis and upper back pain Sees [facility] for primary care  2023 Called office having 2 days of atypical resting pain at night Had two episodes 4 hours apart 02/21/22 and then again am of 02/22/22 Left sided under breast Did have some relief with nitro during evening episodes   PET/CT done 5/31 normal with increase in EF with stress, normal blood flow reserve and normal perfusion   Having some dizziness that is non postural and likely vestibular  ***  Current Outpatient Medications  Medication Sig Dispense Refill   cephALEXin  (KEFLEX ) 250 MG capsule Take 250 mg by mouth daily.     Cholecalciferol  (VITAMIN D3) 2000 UNITS capsule Take 2,000 Units by mouth daily. 1 tablet once a day     clopidogrel  (PLAVIX ) 75 MG tablet TAKE 1 TABLET (75 MG TOTAL) BY MOUTH DAILY. (Patient taking differently: Take 75 mg by mouth daily.) 90 tablet 0   fluorouracil (EFUDEX) 5 % cream Apply topically.     hydrochlorothiazide  (HYDRODIURIL ) 12.5 MG tablet Take 1 tablet (12.5 mg total) by mouth  daily. 30 tablet 0   losartan  (COZAAR ) 25 MG tablet Take 25 mg by mouth in the morning and at bedtime.     meloxicam (MOBIC) 7.5 MG tablet Take 7.5 mg by mouth as needed for pain.     nitroGLYCERIN  (NITROSTAT ) 0.4 MG SL tablet Place 1 tablet (0.4 mg total) under the tongue every 5 (five) minutes as needed. 25 tablet 3   pantoprazole  (PROTONIX ) 40 MG tablet Take 80 mg by mouth daily.  1   propranolol  (INDERAL ) 40 MG tablet Take 1 tablet (40 mg total) by mouth 2 (two) times daily as needed (for SBP >180). BP over 160 60 tablet 0   ULTRAM  50 MG tablet Take 50 mg by mouth 3 (three) times daily as needed for moderate pain (pain score 4-6) or severe pain (pain score 7-10).     No current facility-administered medications for this visit.    No Known Allergies  Social History   Socioeconomic History   Marital status: Divorced    Spouse name: Not on file   Number of children: Y   Years of education: some colle   Highest education level: Not on file  Occupational History   Occupation: Chiropractor: RETIRED    Comment: [employer]   Occupation: travel agent  Tobacco Use   Smoking status: Former  Current packs/day: 0.00    Average packs/day: 0.1 packs/day for 1 year (0.1 ttl pk-yrs)    Types: Cigarettes    Start date: 11/05/1959    Quit date: 11/04/1960    Years since quitting: 63.8   Smokeless tobacco: Never   Tobacco comments:    only smoked socially in early 46s  Substance and Sexual Activity   Alcohol use: Yes    Alcohol/week: 2.0 - 3.0 standard drinks of alcohol    Types: 2 - 3 Glasses of wine per week    Comment: socially   Drug use: No   Sexual activity: Never  Other Topics Concern   Not on file  Social History Narrative   Lives with her boyfriend, Gerlene Denison   Social Drivers of Health   Financial Resource Strain: Low Risk  (04/12/2024)   Received from [facility]   Overall Financial Resource Strain (CARDIA)    Difficulty of Paying Living Expenses: Not  hard at all  Food Insecurity: No Food Insecurity (04/12/2024)   Received from [facility]   Hunger Vital Sign    Within the past 12 months, you worried that your food would run out before you got the money to buy more.: Never true    Within the past 12 months, the food you bought just didn't last and you didn't have money to get more.: Never true  Transportation Needs: No Transportation Needs (04/12/2024)   Received from [facility] - Transportation    Lack of Transportation (Medical): No    Lack of Transportation (Non-Medical): No  Physical Activity: Insufficiently Active (04/12/2024)   Received from [facility]   Exercise Vital Sign    On average, how many days per week do you engage in moderate to strenuous exercise (like a brisk walk)?: 3 days    On average, how many minutes do you engage in exercise at this level?: 30 min  Stress: No Stress Concern Present (04/12/2024)   Received from [facility] - Occupational Stress Questionnaire    Feeling of Stress : Not at all  Social Connections: Socially Integrated (04/12/2024)   Received [facility]   Social Network    How would you rate your social network (family, work, friends)?: Good participation with social networks  Intimate Partner Violence: Not At Risk (04/12/2024)   Received from [facility]   HITS    Over the last 12 months how often did your partner physically hurt you?: Never    Over the last 12 months how often did your partner insult you or talk down to you?: Never    Over the last 12 months how often did your partner threaten you with physical harm?: Never    Over the last 12 months how often did your partner scream or curse at you?: Never     Review of Systems: General: negative for chills, fever, night sweats or weight changes.  Cardiovascular: negative for chest pain, dyspnea on exertion, edema, orthopnea, palpitations, paroxysmal nocturnal dyspnea or  shortness of breath Dermatological: negative for rash Respiratory: negative for cough or wheezing Urologic: negative for hematuria Abdominal: negative for nausea, vomiting, diarrhea, bright red blood per rectum, melena, or hematemesis Neurologic: negative for visual changes, syncope, or dizziness All other systems reviewed and are otherwise negative except as noted above.    There were no vitals taken for this visit.  Affect appropriate Healthy:  appears stated age HEENT: normal Neck supple with  no adenopathy JVP normal no bruits no thyromegaly Lungs clear with no wheezing and good diaphragmatic motion Heart:  S1/S2 AV sclerosis  murmur, no rub, gallop or click PMI normal Abdomen: benighn, BS positve, no tenderness, no AAA no bruit.  No HSM or HJR Distal pulses intact with no bruits No edema Neuro non-focal Skin warm and dry No muscular weakness   EKG Sinus bradycardia 51 bpm low voltage and flat ST segments   ASSESSMENT AND PLAN:   1. CAD: s/p LAD stenting 2009 . Normal myovue without ischemia 08/07/18 Recurrent chest pain somewhat atypical at rest but some relief with nitro PET CT scan done 04/03/22 normal including resting/stress EF and blood flow TTE done 10/20/23 EF 60-65%   2. HTN :Labile  continue losartan  25 mg and hydrochlorothiazide  12.5 mg daily   3. HLD: on Crestor  and Zetia   Followed by PCP, Dr. Jodie.    4. Pre Syncope:  Related to coreg  monitor with self limited SVT Seen by EP Dr Inocencio observation only Most recent echo 10/20/23 with normal EF no effusion MAC and AV sclerosis  5. Cystitis:  Chronic seeing [facility] with [facility] for cystoscopy latter this month Continue Keflex   6. Dizziness:  chronic not postural likely vestibular F/U with primary  ***   PLAN F/u with me in a year    [employer]

## 2024-09-14 ENCOUNTER — Ambulatory Visit: Admitting: Cardiovascular Disease

## 2024-09-20 ENCOUNTER — Ambulatory Visit (HOSPITAL_BASED_OUTPATIENT_CLINIC_OR_DEPARTMENT_OTHER): Attending: Family Medicine | Admitting: Physical Therapy

## 2024-09-27 ENCOUNTER — Ambulatory Visit (HOSPITAL_BASED_OUTPATIENT_CLINIC_OR_DEPARTMENT_OTHER): Admitting: Physical Therapy

## 2024-09-28 ENCOUNTER — Other Ambulatory Visit: Payer: Self-pay

## 2024-09-28 ENCOUNTER — Emergency Department (HOSPITAL_BASED_OUTPATIENT_CLINIC_OR_DEPARTMENT_OTHER)
Admission: EM | Admit: 2024-09-28 | Discharge: 2024-09-28 | Disposition: A | Discharge status: Home / Self Care | Attending: Emergency Medicine | Admitting: Emergency Medicine

## 2024-09-28 ENCOUNTER — Encounter (HOSPITAL_BASED_OUTPATIENT_CLINIC_OR_DEPARTMENT_OTHER): Payer: Self-pay

## 2024-09-28 DIAGNOSIS — I10 Essential (primary) hypertension: Secondary | ICD-10-CM | POA: Insufficient documentation

## 2024-09-28 DIAGNOSIS — Z87891 Personal history of nicotine dependence: Secondary | ICD-10-CM | POA: Diagnosis not present

## 2024-09-28 DIAGNOSIS — I2510 Atherosclerotic heart disease of native coronary artery without angina pectoris: Secondary | ICD-10-CM | POA: Diagnosis not present

## 2024-09-28 MED ORDER — HYDRALAZINE HCL 25 MG PO TABS
25.0000 mg | ORAL_TABLET | Freq: Three times a day (TID) | ORAL | 0 refills | Status: AC | PRN
Start: 2024-09-28 — End: ?

## 2024-09-28 NOTE — ED Triage Notes (Signed)
 Complaining of high blood pressure all day yesterday and this morning. She has been taking losartan  25 mg and propranolol  10 mg which she took at 3:40 this morning. Is not helping with the bp

## 2024-09-28 NOTE — ED Provider Notes (Signed)
 [facility]ERGENC[facility] Emergency Department Provider Note MRN:  [id number]  Arrival date & time: 09/28/24     Chief Complaint   Hypertension   History of Present Illness   Kathy George is a 83 y.o. year-old female with a history of CAD, stroke presenting to the ED with chief complaint of retention.  Blood pressure is high for the past few days.  Up to 200 systolic on multiple occasions today.  Denies headache, no blurred vision, no chest pain, no pain of any kind, no numbness or weakness to the arms or legs.  Is been taking her losartan  and is prescribed propranolol  to be used as needed but it does not seem to be helping.  Review of Systems  A thorough review of systems was obtained and all systems are negative except as noted in the HPI and PMH.   Patient's Health History    Past Medical History:  Diagnosis Date   Anemia    Anxiety    CAD (coronary artery disease)    successful IVUs guided PCI of the lesion in the  proximal LAD using a promus drug-eluting stent with improvement in Fentanyl narrowing from 80% to 0%   Depression    Dyslipidemia    GERD (gastroesophageal reflux disease)    H/O: hysterectomy    Hiatal hernia    Hypertension    Paroxysmal atrial fibrillation (HCC)    Raynaud's disease    Scleroderma (HCC)    Supraventricular tachycardia    SUPRAVENTRICULAR TACHYCARDIA     Past Surgical History:  Procedure Laterality Date   BREAST ENHANCEMENT SURGERY     right rotator cuff repair     TOTAL ABDOMINAL HYSTERECTOMY      Family History  Problem Relation Age of Onset   Kidney disease Mother        chronic; intracranial hemorrhage   Coronary artery disease Other        grandfather   Hyperlipidemia Daughter    Rheum arthritis Maternal Aunt    Ovarian cancer Maternal Grandmother     Social History   Socioeconomic History   Marital status: Divorced    Spouse name: Not on file   Number of children: Y   Years of education: some colle    Highest education level: Not on file  Occupational History   Occupation: Chiropractor: RETIRED    Comment: [employer]   Occupation: travel agent  Tobacco Use   Smoking status: Former    Current packs/day: 0.00    Average packs/day: 0.1 packs/day for 1 year (0.1 ttl pk-yrs)    Types: Cigarettes    Start date: 11/05/1959    Quit date: 11/04/1960    Years since quitting: 63.9   Smokeless tobacco: Never   Tobacco comments:    only smoked socially in early 49s  Substance and Sexual Activity   Alcohol use: Yes    Alcohol/week: 2.0 - 3.0 standard drinks of alcohol    Types: 2 - 3 Glasses of wine per week    Comment: socially   Drug use: No   Sexual activity: Never  Other Topics Concern   Not on file  Social History Narrative   Lives with her boyfriend, Gerlene Denison   Social Drivers of Health   Financial Resource Strain: Low Risk  (04/12/2024)   Received from [facility]   Overall Financial Resource Strain (CARDIA)    Difficulty of Paying Living Expenses: Not hard at all  Food  Insecurity: No Food Insecurity (04/12/2024)   Received from [facility]   Hunger Vital Sign    Within the past 12 months, you worried that your food would run out before you got the money to buy more.: Never true    Within the past 12 months, the food you bought just didn't last and you didn't have money to get more.: Never true  Transportation Needs: No Transportation Needs (04/12/2024)   Received from [facility]   PRAPARE - Transportation    Lack of Transportation (Medical): No    Lack of Transportation (Non-Medical): No  Physical Activity: Insufficiently Active (04/12/2024)   Received from [facility]   Exercise Vital Sign    On average, how many days per week do you engage in moderate to strenuous exercise (like a brisk walk)?: 3 days    On average, how many minutes do you engage in exercise at this level?: 30 min  Stress: No Stress Concern Present (04/12/2024)   Received from [facility] - Occupational Stress Questionnaire    Feeling of Stress : Not at all  Social Connections: Socially Integrated (04/12/2024)   Received from [facility]   Social Network    How would you rate your social network (family, work, friends)?: Good participation with social networks  Intimate Partner Violence: Not At Risk (04/12/2024)   Received from [facility]   HITS    Over the last 12 months how often did your partner physically hurt you?: Never    Over the last 12 months how often did your partner insult you or talk down to you?: Never    Over the last 12 months how often did your partner threaten you with physical harm?: Never    Over the last 12 months how often did your partner scream or curse at you?: Never     Physical Exam   Vitals:   09/28/24 0422  BP: (!) 218/92  Pulse: (!) 47  Resp: 20  Temp: 98.7 F (37.1 C)  SpO2: 98%    CONSTITUTIONAL: Well-appearing, NAD NEURO/PSYCH:  Alert and oriented x 3, no focal deficits EYES:  eyes equal and reactive ENT/NECK:  no LAD, no JVD CARDIO: Regular rate, well-perfused, normal S1 and S2 PULM:  CTAB no wheezing or rhonchi GI/GU:  non-distended, non-tender MSK/SPINE:  No gross deformities, no edema SKIN:  no rash, atraumatic   *Additional and/or pertinent findings included in MDM below  Diagnostic and Interventional Summary    EKG Interpretation Date/Time:    Ventricular Rate:    PR Interval:    QRS Duration:    QT Interval:    QTC Calculation:   R Axis:      Text Interpretation:         Labs Reviewed - No data to display  No orders to display    Medications - No data to display   Procedures  /  Critical Care Procedures  ED Course and Medical Decision Making  Initial Impression and Ddx This is a presentation for asymptomatic hypertension.  Blood pressure over 200 systolic on arrival.  Seems to be self correcting down to 170s systolic.  Patient well-appearing in  no acute distress with no symptoms.  Past medical/surgical history that increases complexity of ED encounter: Stroke, CAD  Interpretation of Diagnostics I personally reviewed the Laboratory Testing and my interpretation is as follows: Labs obtained from a few days ago showing normal renal function    Patient  Reassessment and Ultimate Disposition/Management     No indication for further testing or admission, appropriate for discharge with PCP follow-up.  Patient management required discussion with the following services or consulting groups:  None  Complexity of Problems Addressed Acute illness or injury that poses threat of life of bodily function  Additional Data Reviewed and Analyzed Further history obtained from: Further history from spouse/family member  Additional Factors Impacting ED Encounter Risk Prescriptions  Ozell HERO. Theadore, MD [facility] Emergency Medicine [facility] [email or web address]  Final Clinical Impressions(s) / ED Diagnoses     ICD-10-CM   1. Hypertension, unspecified type  I10       ED Discharge Orders          Ordered    hydrALAZINE  (APRESOLINE ) 25 MG tablet  Every 8 hours PRN        09/28/24 0513             Discharge Instructions Discussed with and Provided to Patient:    Discharge Instructions      You were evaluated in the Emergency Department and after careful evaluation, we did not find any emergent condition requiring admission or further testing in the hospital.  Your exam/testing today is overall reassuring.  Recommend follow-up with your primary care doctor to discuss your blood pressure management.  Until then you can use the hydralazine  as needed for a blood pressure greater than 185 systolic (that is the number on top)  Please return to the Emergency Department if you experience any worsening of your condition.   Thank you for allowing us  to be a part of your care.      Theadore Ozell HERO, MD 09/28/24  [phone]

## 2024-09-28 NOTE — Discharge Instructions (Signed)
 You were evaluated in the Emergency Department and after careful evaluation, we did not find any emergent condition requiring admission or further testing in the hospital.  Your exam/testing today is overall reassuring.  Recommend follow-up with your primary care doctor to discuss your blood pressure management.  Until then you can use the hydralazine  as needed for a blood pressure greater than 185 systolic (that is the number on top)  Please return to the Emergency Department if you experience any worsening of your condition.   Thank you for allowing us  to be a part of your care.

## 2024-09-29 ENCOUNTER — Ambulatory Visit (HOSPITAL_BASED_OUTPATIENT_CLINIC_OR_DEPARTMENT_OTHER): Admitting: Physical Therapy

## 2024-10-05 ENCOUNTER — Ambulatory Visit (HOSPITAL_BASED_OUTPATIENT_CLINIC_OR_DEPARTMENT_OTHER): Admitting: Physical Therapy

## 2024-10-07 ENCOUNTER — Ambulatory Visit (HOSPITAL_BASED_OUTPATIENT_CLINIC_OR_DEPARTMENT_OTHER): Admitting: Physical Therapy

## 2024-10-12 ENCOUNTER — Ambulatory Visit (HOSPITAL_BASED_OUTPATIENT_CLINIC_OR_DEPARTMENT_OTHER): Admitting: Physical Therapy

## 2024-10-15 ENCOUNTER — Encounter (HOSPITAL_BASED_OUTPATIENT_CLINIC_OR_DEPARTMENT_OTHER)

## 2024-10-19 ENCOUNTER — Encounter (HOSPITAL_BASED_OUTPATIENT_CLINIC_OR_DEPARTMENT_OTHER)

## 2024-10-22 ENCOUNTER — Encounter (HOSPITAL_BASED_OUTPATIENT_CLINIC_OR_DEPARTMENT_OTHER)

## 2024-10-25 ENCOUNTER — Encounter (HOSPITAL_BASED_OUTPATIENT_CLINIC_OR_DEPARTMENT_OTHER)

## 2024-11-07 ENCOUNTER — Emergency Department (HOSPITAL_BASED_OUTPATIENT_CLINIC_OR_DEPARTMENT_OTHER)
Admission: EM | Admit: 2024-11-07 | Discharge: 2024-11-07 | Disposition: A | Discharge status: Home / Self Care | Attending: Emergency Medicine | Admitting: Emergency Medicine

## 2024-11-07 ENCOUNTER — Encounter (HOSPITAL_BASED_OUTPATIENT_CLINIC_OR_DEPARTMENT_OTHER): Payer: Self-pay | Admitting: Emergency Medicine

## 2024-11-07 ENCOUNTER — Other Ambulatory Visit: Payer: Self-pay

## 2024-11-07 DIAGNOSIS — I10 Essential (primary) hypertension: Secondary | ICD-10-CM | POA: Diagnosis not present

## 2024-11-07 DIAGNOSIS — R030 Elevated blood-pressure reading, without diagnosis of hypertension: Secondary | ICD-10-CM | POA: Diagnosis present

## 2024-11-07 LAB — CBC WITH DIFFERENTIAL/PLATELET
Abs Immature Granulocytes: 0.01 K/uL (ref 0.00–0.07)
Basophils Absolute: 0 K/uL (ref 0.0–0.1)
Basophils Relative: 0 %
Eosinophils Absolute: 0.1 K/uL (ref 0.0–0.5)
Eosinophils Relative: 3 %
HCT: 38.5 % (ref 36.0–46.0)
Hemoglobin: 13 g/dL (ref 12.0–15.0)
Immature Granulocytes: 0 %
Lymphocytes Relative: 24 %
Lymphs Abs: 1.2 K/uL (ref 0.7–4.0)
MCH: 29.1 pg (ref 26.0–34.0)
MCHC: 33.8 g/dL (ref 30.0–36.0)
MCV: 86.1 fL (ref 80.0–100.0)
Monocytes Absolute: 0.3 K/uL (ref 0.1–1.0)
Monocytes Relative: 6 %
Neutro Abs: 3.1 K/uL (ref 1.7–7.7)
Neutrophils Relative %: 67 %
Platelets: 164 K/uL (ref 150–400)
RBC: 4.47 MIL/uL (ref 3.87–5.11)
RDW: 15.1 % (ref 11.5–15.5)
WBC: 4.7 K/uL (ref 4.0–10.5)
nRBC: 0 % (ref 0.0–0.2)

## 2024-11-07 LAB — BASIC METABOLIC PANEL WITH GFR
Anion gap: 13 (ref 5–15)
BUN: 25 mg/dL — ABNORMAL HIGH (ref 8–23)
CO2: 24 mmol/L (ref 22–32)
Calcium: 10.9 mg/dL — ABNORMAL HIGH (ref 8.9–10.3)
Chloride: 104 mmol/L (ref 98–111)
Creatinine, Ser: 1.15 mg/dL — ABNORMAL HIGH (ref 0.44–1.00)
GFR, Estimated: 46 mL/min — ABNORMAL LOW
Glucose, Bld: 105 mg/dL — ABNORMAL HIGH (ref 70–99)
Potassium: 3.6 mmol/L (ref 3.5–5.1)
Sodium: 141 mmol/L (ref 135–145)

## 2024-11-07 MED ORDER — LABETALOL HCL 5 MG/ML IV SOLN
5.0000 mg | Freq: Once | INTRAVENOUS | Status: AC
  Administered 2024-11-07: 5 mg via INTRAVENOUS
  Filled 2024-11-07: qty 4

## 2024-11-07 NOTE — ED Triage Notes (Signed)
 Patient c/o hypertension, 217/96 at home.  Patient denies any symptoms.  Patient is compliant with meds.

## 2024-11-07 NOTE — ED Provider Notes (Signed)
 " [place] EMERGENCY DEPARTMENT AT [facility] Provider Note   CSN: [id number] Arrival date & time: 11/07/24  0250     Patient presents with: Hypertension   Kathy George is a 84 y.o. female.   Patient presents to the emergency department with concerns over blood pressure.  Patient reports that she has a history of high blood pressure, has been having difficulty managing it recently.  Patient reports that she has prescribed losartan  to be taken 2 tablets in the morning, 1 at night.  She reports that if she takes the 2 tablets in the morning, her blood pressure drops too much so she has been splitting it up throughout the day.  Today she noticed that her blood pressure was more elevated than usual.  She presents with multiple blood pressure readings that are elevated.  She has taken 4 losartan  25 mg tablets divided throughout the day.  She has also taken 1 hydralazine  25 mg tablet around midnight tonight.  She reports that it did not help much.  No headache, vision change, chest pain, shortness of breath.       Prior to Admission medications  Medication Sig Start Date End Date Taking? Authorizing Provider  cephALEXin  (KEFLEX ) 250 MG capsule Take 250 mg by mouth daily.    [provider]  Cholecalciferol  (VITAMIN D3) 2000 UNITS capsule Take 2,000 Units by mouth daily. 1 tablet once a day 09/11/11   [provider]  clopidogrel  (PLAVIX ) 75 MG tablet TAKE 1 TABLET (75 MG TOTAL) BY MOUTH DAILY. Patient taking differently: Take 75 mg by mouth daily. 12/07/15   Nishan, Peter C, MD  fluorouracil (EFUDEX) 5 % cream Apply topically. 10/06/23   [provider]  hydrALAZINE  (APRESOLINE ) 25 MG tablet Take 1 tablet (25 mg total) by mouth every 8 (eight) hours as needed. 09/28/24   Theadore Ozell HERO, MD  hydrochlorothiazide  (HYDRODIURIL ) 12.5 MG tablet Take 1 tablet (12.5 mg total) by mouth daily. 10/21/23   Raenelle Coria, MD  losartan  (COZAAR ) 25 MG tablet Take 25 mg by  mouth in the morning and at bedtime. 07/21/23 07/20/24  [provider]  meloxicam (MOBIC) 7.5 MG tablet Take 7.5 mg by mouth as needed for pain. 03/08/20   [provider]  nitroGLYCERIN  (NITROSTAT ) 0.4 MG SL tablet Place 1 tablet (0.4 mg total) under the tongue every 5 (five) minutes as needed. 06/14/20   Delford Maude BROCKS, MD  pantoprazole  (PROTONIX ) 40 MG tablet Take 80 mg by mouth daily. 08/15/18   [provider]  propranolol  (INDERAL ) 40 MG tablet Take 1 tablet (40 mg total) by mouth 2 (two) times daily as needed (for SBP >180). BP over 160 10/21/23   Raenelle Coria, MD  ULTRAM  50 MG tablet Take 50 mg by mouth 3 (three) times daily as needed for moderate pain (pain score 4-6) or severe pain (pain score 7-10). 10/11/23   [provider]    Allergies: Patient has no known allergies.    Review of Systems  Updated Vital Signs BP (!) 188/108   Pulse 74   Temp (!) 97.5 F (36.4 C)   Resp 18   Wt 63 kg   SpO2 100%   BMI 24.60 kg/m   Physical Exam Vitals and nursing note reviewed.  Constitutional:      General: She is not in acute distress.    Appearance: She is well-developed.  HENT:     Head: Normocephalic and atraumatic.     Mouth/Throat:  Mouth: Mucous membranes are moist.  Eyes:     General: Vision grossly intact. Gaze aligned appropriately.     Extraocular Movements: Extraocular movements intact.     Conjunctiva/sclera: Conjunctivae normal.  Cardiovascular:     Rate and Rhythm: Normal rate and regular rhythm.     Pulses: Normal pulses.     Heart sounds: Normal heart sounds, S1 normal and S2 normal. No murmur heard.    No friction rub. No gallop.  Pulmonary:     Effort: Pulmonary effort is normal. No respiratory distress.     Breath sounds: Normal breath sounds.  Abdominal:     General: Bowel sounds are normal.     Palpations: Abdomen is soft.     Tenderness: There is no abdominal tenderness. There is no guarding or rebound.      Hernia: No hernia is present.  Musculoskeletal:        General: No swelling.     Cervical back: Full passive range of motion without pain, normal range of motion and neck supple. No spinous process tenderness or muscular tenderness. Normal range of motion.     Right lower leg: No edema.     Left lower leg: No edema.  Skin:    General: Skin is warm and dry.     Capillary Refill: Capillary refill takes less than 2 seconds.     Findings: No ecchymosis, erythema, rash or wound.  Neurological:     General: No focal deficit present.     Mental Status: She is alert and oriented to person, place, and time.     GCS: GCS eye subscore is 4. GCS verbal subscore is 5. GCS motor subscore is 6.     Cranial Nerves: Cranial nerves 2-12 are intact.     Sensory: Sensation is intact.     Motor: Motor function is intact.     Coordination: Coordination is intact.  Psychiatric:        Attention and Perception: Attention normal.        Mood and Affect: Mood normal.        Speech: Speech normal.        Behavior: Behavior normal.     (all labs ordered are listed, but only abnormal results are displayed) Labs Reviewed  BASIC METABOLIC PANEL WITH GFR - Abnormal; Notable for the following components:      Result Value   Glucose, Bld 105 (*)    BUN 25 (*)    Creatinine, Ser 1.15 (*)    Calcium  10.9 (*)    GFR, Estimated 46 (*)    All other components within normal limits  CBC WITH DIFFERENTIAL/PLATELET    EKG: EKG Interpretation Date/Time:  Sunday November 07 2024 03:40:30 EST Ventricular Rate:  74 PR Interval:  170 QRS Duration:  92 QT Interval:  402 QTC Calculation: 446 R Axis:   -69  Text Interpretation: Sinus rhythm Left anterior fascicular block Low voltage, precordial leads Consider anterior infarct No significant change since last tracing Confirmed by Haze Lonni PARAS [phone]) on 11/07/2024 3:43:09 AM  Radiology: No results found.   Procedures   Medications Ordered in the ED   labetalol  (NORMODYNE ) injection 5 mg (5 mg Intravenous Given 11/07/24 0344)                                    Medical Decision Making Amount and/or Complexity of Data Reviewed Labs: ordered.  Risk Prescription drug management.   Patient with history of hypertension, presents to the emergency department for elevated blood pressure despite taking extra blood pressure medications tonight.  Reviewing her records, her blood pressure was slightly elevated when she last saw her primary care doctor 1 month ago.  No medication changes at that time because she reported possibly missing a dose.  Patient has been seen in the ED for elevated blood pressures, prescribed hydralazine  as needed.  She did use this medication once tonight, waited a couple of hours but did not see improvement.  Patient seems anxious at arrival but otherwise in no distress.  Blood pressure is elevated, 188/108.  No fever.  Basic labs are unremarkable.  Patient given a small dose of labetalol  and her blood pressure is improved.  Patient reports that if she takes her full dose of losartan  in the morning her blood pressure often drops.  I suspect that some of her elevations are secondary to the anxiety of frequent blood pressure checking.  I recommended that she schedule her blood pressure checks, not repeat checks throughout the day.  She needs to follow-up with primary care for further instructions.  I do not believe she requires medication changes at this time.     Final diagnoses:  Primary hypertension    ED Discharge Orders     None          Haze Lonni PARAS, MD 11/07/24 [phone]  "

## 2024-11-09 ENCOUNTER — Telehealth: Payer: Self-pay

## 2024-11-09 NOTE — Telephone Encounter (Signed)
 Copied from CRM [id number]. Topic: Appointments - Scheduling Inquiry for Clinic >> Nov 08, 2024 11:57 AM Kathy George wrote: Reason for CRM: Pt wants to become a pt with Dr.Andy and stated that her daughter Kathy George is a current pt of Dr.Andy. Pt stated that she use to see Dr.Andy a few years ago and wanted to see if she would be willing to accept her as a new pt. Pt would like a callback with an update.  Please see pt call and TOC request and advise

## 2024-11-10 NOTE — Telephone Encounter (Signed)
 LVM to schedule New Patient appointment approved by Dr. Jodie.  Please transfer call to front office.

## 2024-11-10 NOTE — Telephone Encounter (Signed)
 Please see response from provider and call patient to schedule TOC appt

## 2024-12-15 ENCOUNTER — Ambulatory Visit: Admitting: Family Medicine
